# Patient Record
Sex: Male | Born: 1975
Health system: Southern US, Community
[De-identification: ages and names within clinical notes are randomized; demographics above are authoritative.]

## PROBLEM LIST (undated history)

## (undated) DIAGNOSIS — I10 Essential (primary) hypertension: Secondary | ICD-10-CM

## (undated) DIAGNOSIS — E119 Type 2 diabetes mellitus without complications: Secondary | ICD-10-CM

## (undated) HISTORY — DX: Essential (primary) hypertension: I10

## (undated) HISTORY — DX: Morbid (severe) obesity due to excess calories: E66.01

---

## 1999-07-29 ENCOUNTER — Emergency Department (HOSPITAL_COMMUNITY): Admission: EM | Admit: 1999-07-29 | Discharge: 1999-07-29 | Payer: Self-pay | Admitting: Internal Medicine

## 2000-08-27 ENCOUNTER — Emergency Department (HOSPITAL_COMMUNITY): Admission: EM | Admit: 2000-08-27 | Discharge: 2000-08-27 | Payer: Self-pay | Admitting: *Deleted

## 2001-01-15 ENCOUNTER — Emergency Department (HOSPITAL_COMMUNITY): Admission: EM | Admit: 2001-01-15 | Discharge: 2001-01-15 | Payer: Self-pay

## 2001-06-21 ENCOUNTER — Emergency Department (HOSPITAL_COMMUNITY): Admission: EM | Admit: 2001-06-21 | Discharge: 2001-06-21 | Payer: Self-pay | Admitting: Emergency Medicine

## 2001-06-21 ENCOUNTER — Encounter: Payer: Self-pay | Admitting: Emergency Medicine

## 2001-07-01 ENCOUNTER — Emergency Department (HOSPITAL_COMMUNITY): Admission: EM | Admit: 2001-07-01 | Discharge: 2001-07-01 | Payer: Self-pay | Admitting: Internal Medicine

## 2001-11-07 ENCOUNTER — Encounter: Payer: Self-pay | Admitting: Emergency Medicine

## 2001-11-07 ENCOUNTER — Emergency Department (HOSPITAL_COMMUNITY): Admission: EM | Admit: 2001-11-07 | Discharge: 2001-11-07 | Payer: Self-pay | Admitting: Emergency Medicine

## 2002-11-24 ENCOUNTER — Emergency Department (HOSPITAL_COMMUNITY): Admission: EM | Admit: 2002-11-24 | Discharge: 2002-11-24 | Payer: Self-pay | Admitting: Emergency Medicine

## 2005-10-26 ENCOUNTER — Emergency Department (HOSPITAL_COMMUNITY): Admission: EM | Admit: 2005-10-26 | Discharge: 2005-10-26 | Payer: Self-pay | Admitting: Emergency Medicine

## 2009-09-09 ENCOUNTER — Observation Stay (HOSPITAL_COMMUNITY): Admission: EM | Admit: 2009-09-09 | Discharge: 2009-09-10 | Payer: Self-pay | Admitting: Emergency Medicine

## 2009-10-05 DIAGNOSIS — I10 Essential (primary) hypertension: Secondary | ICD-10-CM

## 2009-10-05 DIAGNOSIS — R079 Chest pain, unspecified: Secondary | ICD-10-CM

## 2009-10-05 HISTORY — DX: Essential (primary) hypertension: I10

## 2009-10-08 ENCOUNTER — Ambulatory Visit: Payer: Self-pay | Admitting: Cardiovascular Disease

## 2009-10-08 DIAGNOSIS — R9431 Abnormal electrocardiogram [ECG] [EKG]: Secondary | ICD-10-CM

## 2009-10-09 ENCOUNTER — Encounter: Payer: Self-pay | Admitting: Cardiovascular Disease

## 2009-10-16 ENCOUNTER — Ambulatory Visit: Payer: Self-pay | Admitting: Internal Medicine

## 2009-11-08 ENCOUNTER — Encounter: Payer: Self-pay | Admitting: Cardiovascular Disease

## 2009-11-08 ENCOUNTER — Ambulatory Visit (HOSPITAL_COMMUNITY): Admission: RE | Admit: 2009-11-08 | Discharge: 2009-11-08 | Payer: Self-pay | Admitting: Cardiovascular Disease

## 2009-11-08 ENCOUNTER — Ambulatory Visit: Payer: Self-pay

## 2009-11-08 ENCOUNTER — Encounter (INDEPENDENT_AMBULATORY_CARE_PROVIDER_SITE_OTHER): Payer: Self-pay

## 2009-11-08 ENCOUNTER — Ambulatory Visit: Payer: Self-pay | Admitting: Cardiology

## 2009-12-10 ENCOUNTER — Emergency Department (HOSPITAL_COMMUNITY): Admission: EM | Admit: 2009-12-10 | Discharge: 2009-12-10 | Payer: Self-pay | Admitting: Emergency Medicine

## 2009-12-26 ENCOUNTER — Encounter: Payer: Self-pay | Admitting: Cardiovascular Disease

## 2010-01-24 ENCOUNTER — Emergency Department (HOSPITAL_COMMUNITY): Admission: EM | Admit: 2010-01-24 | Discharge: 2010-01-24 | Payer: Self-pay | Admitting: Emergency Medicine

## 2010-04-17 IMAGING — CR DG CHEST 2V
2 series · 2 of 2 positions shown · non-contrast
Comparison: Chest radiograph performed 09/09/2009

CLINICAL DATA: Palpitations, cough and congestion.

CHEST - 2 VIEW

[w chest pa *]
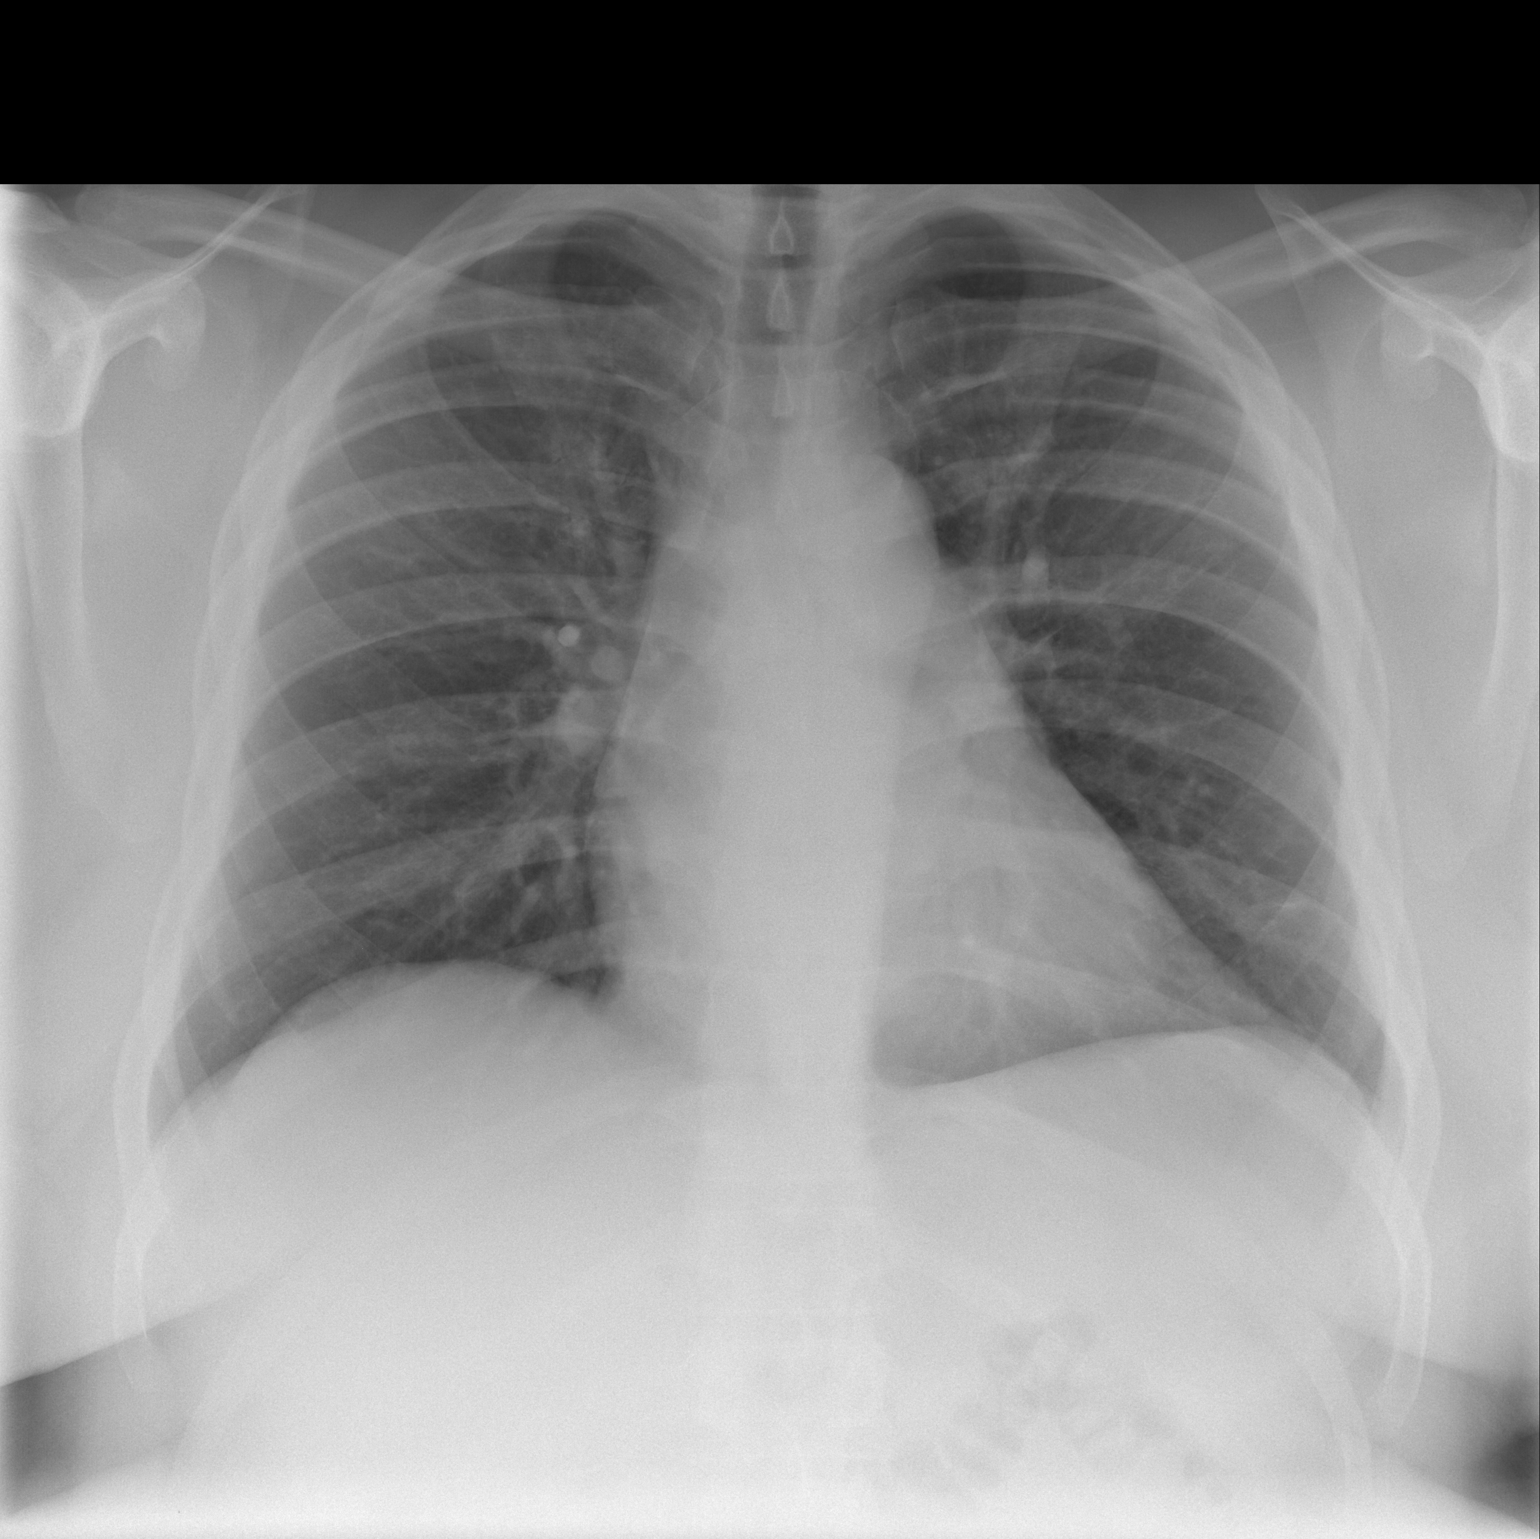

[w chest lat *]
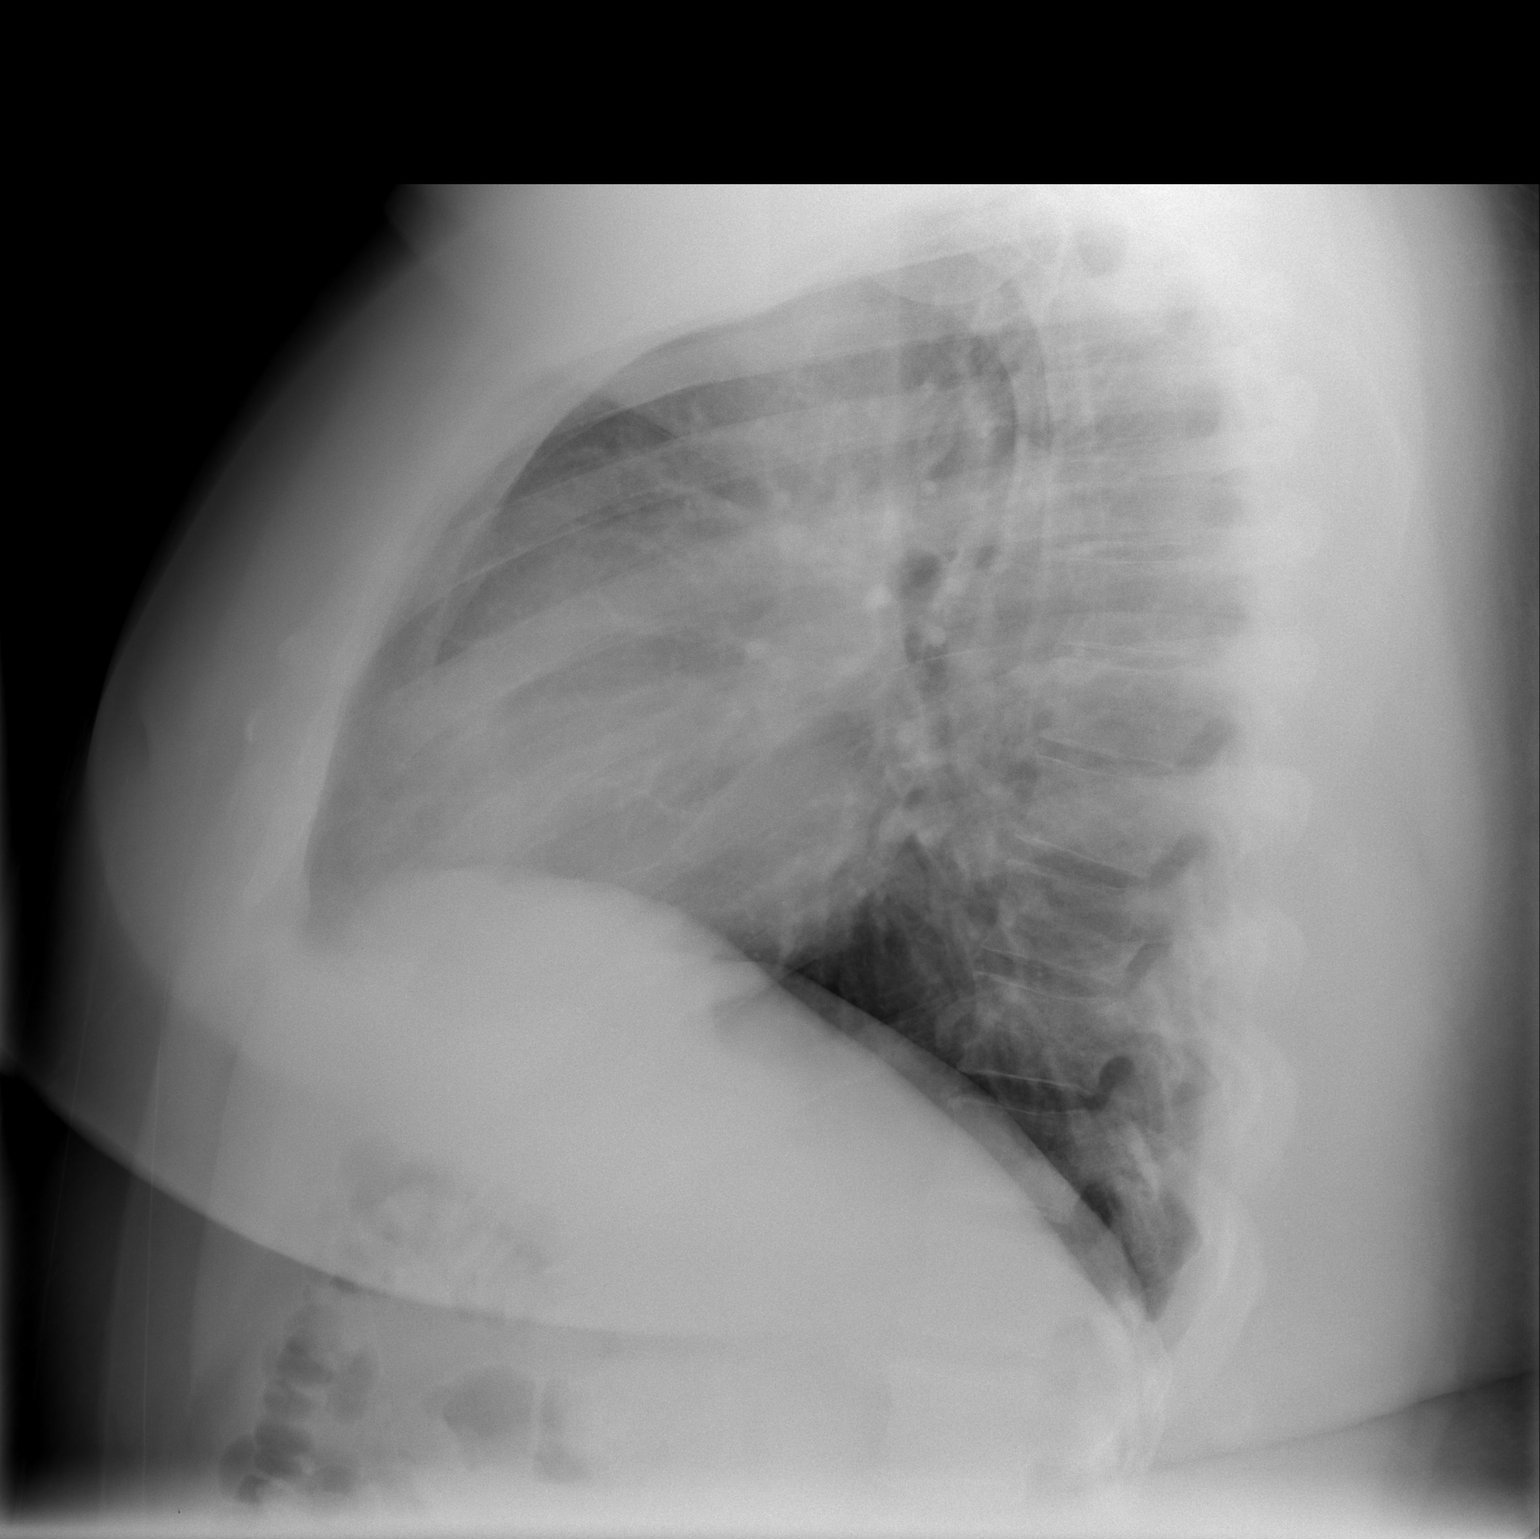

[2 of 2 positions shown; findings below may reference images not displayed]

FINDINGS: The lungs are well-aerated; minimal linear atelectasis is
noted at the left lung base.  There is no evidence of focal
opacification, pleural effusion or pneumothorax.

The heart is normal in size; the mediastinal contour is within
normal limits.  No acute osseous abnormalities are seen.
IMPRESSION: Minimal left basilar linear atelectasis.

## 2010-07-31 ENCOUNTER — Emergency Department (HOSPITAL_COMMUNITY): Admission: EM | Admit: 2010-07-31 | Discharge: 2010-08-01 | Payer: Self-pay | Admitting: Emergency Medicine

## 2010-08-27 ENCOUNTER — Ambulatory Visit: Payer: Self-pay | Admitting: Cardiovascular Disease

## 2010-08-27 DIAGNOSIS — R002 Palpitations: Secondary | ICD-10-CM | POA: Insufficient documentation

## 2010-08-30 ENCOUNTER — Telehealth (INDEPENDENT_AMBULATORY_CARE_PROVIDER_SITE_OTHER): Payer: Self-pay | Admitting: *Deleted

## 2010-09-06 ENCOUNTER — Ambulatory Visit: Payer: Self-pay | Admitting: Cardiovascular Disease

## 2010-11-08 ENCOUNTER — Telehealth (INDEPENDENT_AMBULATORY_CARE_PROVIDER_SITE_OTHER): Payer: Self-pay | Admitting: *Deleted

## 2010-11-11 ENCOUNTER — Telehealth: Payer: Self-pay | Admitting: Cardiovascular Disease

## 2010-11-28 ENCOUNTER — Emergency Department (HOSPITAL_COMMUNITY): Admission: EM | Admit: 2010-11-28 | Discharge: 2010-07-15 | Payer: Self-pay | Admitting: Emergency Medicine

## 2010-11-28 ENCOUNTER — Emergency Department (HOSPITAL_COMMUNITY): Admission: EM | Admit: 2010-11-28 | Discharge: 2009-12-30 | Payer: Self-pay | Admitting: Emergency Medicine

## 2011-01-21 NOTE — Progress Notes (Signed)
Summary: rx refill  Phone Note Refill Request Message from:  Patient on November 11, 2010 12:53 PM  Refills Requested: Medication #1:  LISINOPRIL 20 MG TABS Take one tablet by mouth daily. [id number]   Method Requested: Telephone to Pharmacy Initial call taken by: Roe Coombs,  November 11, 2010 12:53 PM    Prescriptions: LISINOPRIL 20 MG TABS (LISINOPRIL) Take one tablet by mouth daily  #30 Tablet x 12   Entered by:   Kem Parkinson   Authorized by:   Colon Branch, MD, [facility]   Signed by:   Kem Parkinson on 11/11/2010   Method used:   Electronically to        [id number] (retail)       [address]       [place], [place]  [address]       Ph: [phone] or [phone]       Fax: [phone]   RxID:   [id number]

## 2011-01-21 NOTE — Progress Notes (Signed)
  Phone Note Outgoing Call   Call placed by: Scherrie Bateman, LPN,  November 08, 2010 10:05 AM Call placed to: Patient Summary of Call: ATTEMPTED TO NOTIFY PT OF MONITOR RESULTS NSR NO SIG [facility] NUM DISCONNECTED AND NOT WORKING TODAY WILL TRY AGAIN ON MON. Initial call taken by: Scherrie Bateman, LPN,  November 08, 2010 10:06 AM  Follow-up for Phone Call        CALLED WORK PT NOT WORKING TODAY [facility] ANOTHER DATE Scherrie Bateman, LPN  November 19, 2010 10:21 AM  PT AWARE OF MONITOR RESULTS. Follow-up by: Scherrie Bateman, LPN,  November 20, 2010 4:04 PM

## 2011-01-21 NOTE — Progress Notes (Signed)
Summary: Event monitor 09/06/10  Phone Note Outgoing Call Call back at [facility] Phone [phone]   Call placed by: Stanton Kidney, EMT-P,  August 30, 2010 4:17 PM Summary of Call: 08/30/10 left message for pt. to call back to schedule Event monitor. 09/02/10 Pt. returned call,...scheduled for 09/06/10 at 11:00 am for event monitor.

## 2011-01-21 NOTE — Assessment & Plan Note (Signed)
Summary: f1y/per pt call/lg   Primary Provider:  Yetta Barre  CC:  chest pain .  History of Present Illness: 35 yo obese male seen prev for atypical SSCP.  Normal stress echo 11/10.  CRF include HTN on Rx. He continues to be stressed and unhappy at work with [employer].  He seems overly concerned with cardiac issues and has very atypical symptoms.  He has sharp constant pain in his neck  A dull ache in his left arm.  Has been to the ER on occasion with R/O.  Has palpitatoins at night.  Has had nitro in past with no immediate help.  Indicated to paitient that his symptoms were atypical and he was too large to have a myovue or cardiac CT.  Invasive cath not warranted at this time given risk of stroke or bleeding complicaitons.  If his Allans test was negative he may be a candidate for radial cath.  Will give him a monitor for his nocturnal palpitaitons and F/U in 6 months.  Continue ASA and Rx of HTN   Current Problems (verified): 1)  Palpitations  (ICD-785.1) 2)  Electrocardiogram, Abnormal  (ICD-794.31) 3)  Hypertension  (ICD-401.9) 4)  Morbid Obesity  (ICD-278.01) 5)  Chest Pain  (ICD-786.50)  Current Medications (verified): 1)  Aspirin 81 Mg Tbec (Aspirin) .... Take One Tablet By Mouth Daily 2)  Lisinopril 20 Mg Tabs (Lisinopril) .... Take One Tablet By Mouth Daily  Allergies (verified): No Known Drug Allergies  Past History:  Past Medical History: Last updated: 10/05/2009 Current Problems:  HYPERTENSION (ICD-401.9) MORBID OBESITY (ICD-278.01) CHEST PAIN (ICD-786.50) Eval at [facility] 2006 and 09/09/2009 R/O no stress testing done Borderline Type 2 DM  HbA1c 6.4 08/2009  Past Surgical History: Last updated: 10/16/2009 Denies surgical history  Family History: Last updated: 10/05/2009   The patient's mother is alive.  She has no history of  coronary artery disease but is a diabetic.  The patient's father died in his  43s from complications of coronary artery disease and diabetes  and had his  first MI in his late 63s at the family's best estimation.  The patient has  two brothers and nine sisters, none of whom have coronary artery disease but  multiple ones do in fact have diabetes.  Social History: Last updated: 10/05/2009 He works at [employer].  He is an occasional   drinker, only maybe on the weekends when he goes out with friends.   Denies any tobacco.  States he did smoke some marijuana earlier this   year but he has never done any other illicit drugs.  He is single at   this point.      Review of Systems       Denies fever, malais, weight loss, blurry vision, decreased visual acuity, cough, sputum, SOB, hemoptysis, pleuritic pain, , heartburn, abdominal pain, melena, lower extremity edema, claudication, or rash.   Vital Signs:  Patient profile:   35 year old male Height:      73 inches Weight:      406 pounds BMI:     53.76 Pulse rate:   80 / minute Resp:     16 per minute BP sitting:   138 / 80  (left arm)  Vitals Entered By: Kem Parkinson (August 27, 2010 3:01 PM)  Physical Exam  General:  Affect appropriate Healthy:  appears stated age HEENT: normal Neck supple with no adenopathy JVP normal no bruits no thyromegaly Lungs clear with no wheezing and  good diaphragmatic motion Heart:  S1/S2 no murmur,rub, gallop or click PMI normal Abdomen: benighn, BS positve, no tenderness, no AAA no bruit.  No HSM or HJR Distal pulses intact with no bruits No edema Neuro non-focal Skin warm and dry    Impression & Recommendations:  Problem # 1:  PALPITATIONS (ICD-785.1) Event monitor and F/U His updated medication list for this problem includes:    Aspirin 81 Mg Tbec (Aspirin) .Marland Kitchen... Take one tablet by mouth daily    Lisinopril 20 Mg Tabs (Lisinopril) .Marland Kitchen... Take one tablet by mouth daily  Orders: Event (Event)  Problem # 2:  CHEST PAIN (ICD-786.50) Atypical, normal stress echo Observe His updated medication list for this problem  includes:    Aspirin 81 Mg Tbec (Aspirin) .Marland Kitchen... Take one tablet by mouth daily    Lisinopril 20 Mg Tabs (Lisinopril) .Marland Kitchen... Take one tablet by mouth daily  Problem # 3:  HYPERTENSION (ICD-401.9) Well controlled The following medications were removed from the medication list:    Hydrochlorothiazide 12.5 Mg Tabs (Hydrochlorothiazide) .Marland Kitchen... Take one tablet by mouth daily. His updated medication list for this problem includes:    Aspirin 81 Mg Tbec (Aspirin) .Marland Kitchen... Take one tablet by mouth daily    Lisinopril 20 Mg Tabs (Lisinopril) .Marland Kitchen... Take one tablet by mouth daily  Patient Instructions: 1)  Your physician recommends that you schedule a follow-up appointment in: 6 MONTHS 2)  Your physician recommends that you continue on your current medications as directed. Please refer to the Current Medication list given to you today. 3)  Your physician has recommended that you wear an event monitor.  Event monitors are medical devices that record the heart's electrical activity. Doctors most often use these monitors to diagnose arrhythmias. Arrhythmias are problems with the speed or rhythm of the heartbeat. The monitor is a small, portable device. You can wear one while you do your normal daily activities. This is usually used to diagnose what is causing palpitations/syncope (passing out).   EKG Report  Procedure date:  08/27/2010  Findings:      NSR 70 Poor R wave progression from body habitus Normal ECG

## 2011-01-21 NOTE — Letter (Signed)
Summary: [facility]   Imported By: Kassie Mends 01/22/2010 09:34:11  _____________________________________________________________________  External Attachment:    Type:   Image     Comment:   External Document

## 2011-03-07 LAB — DIFFERENTIAL
Basophils Absolute: 0 10*3/uL (ref 0.0–0.1)
Basophils Relative: 1 % (ref 0–1)
Eosinophils Absolute: 0.1 10*3/uL (ref 0.0–0.7)
Eosinophils Relative: 2 % (ref 0–5)
Lymphocytes Relative: 30 % (ref 12–46)
Lymphs Abs: 1.4 10*3/uL (ref 0.7–4.0)
Monocytes Absolute: 0.5 10*3/uL (ref 0.1–1.0)
Monocytes Relative: 11 % (ref 3–12)
Neutro Abs: 2.6 10*3/uL (ref 1.7–7.7)
Neutrophils Relative %: 57 % (ref 43–77)

## 2011-03-07 LAB — COMPREHENSIVE METABOLIC PANEL
ALT: 15 U/L (ref 0–53)
AST: 20 U/L (ref 0–37)
Albumin: 4 g/dL (ref 3.5–5.2)
Alkaline Phosphatase: 67 U/L (ref 39–117)
BUN: 7 mg/dL (ref 6–23)
CO2: 24 mEq/L (ref 19–32)
Calcium: 8.9 mg/dL (ref 8.4–10.5)
Chloride: 102 mEq/L (ref 96–112)
Creatinine, Ser: 1.29 mg/dL (ref 0.4–1.5)
GFR calc Af Amer: >60 mL/min (ref >60)
GFR calc non Af Amer: >60 mL/min (ref >60)
Glucose, Bld: 93 mg/dL (ref 70–99)
Potassium: 4 mEq/L (ref 3.5–5.1)
Sodium: 134 mEq/L — ABNORMAL LOW (ref 135–145)
Total Bilirubin: 0.4 mg/dL (ref 0.3–1.2)
Total Protein: 7.2 g/dL (ref 6.0–8.3)

## 2011-03-07 LAB — CBC
Hemoglobin: 16.1 g/dL (ref 13.0–17.0)
MCH: 29.7 pg (ref 26.0–34.0)
Platelets: 235 10*3/uL (ref 150–400)

## 2011-03-07 LAB — POCT CARDIAC MARKERS
Myoglobin, poc: 61.7 ng/mL (ref 12–200)
Troponin i, poc: <0.05 ng/mL (ref 0.00–0.09)

## 2011-03-08 LAB — CBC
Hemoglobin: 15.1 g/dL (ref 13.0–17.0)
MCH: 30.6 pg (ref 26.0–34.0)
Platelets: 230 10*3/uL (ref 150–400)
Platelets: 237 10*3/uL (ref 150–400)
RBC: 4.93 MIL/uL (ref 4.22–5.81)
RBC: 4.81 MIL/uL (ref 4.22–5.81)
RDW: 13.4 % (ref 11.5–15.5)
WBC: 3.6 10*3/uL — ABNORMAL LOW (ref 4.0–10.5)

## 2011-03-08 LAB — BASIC METABOLIC PANEL
BUN: 11 mg/dL (ref 6–23)
Chloride: 104 mEq/L (ref 96–112)
Creatinine, Ser: 1.2 mg/dL (ref 0.4–1.5)
GFR calc Af Amer: >60 mL/min (ref >60)
Glucose, Bld: 150 mg/dL — ABNORMAL HIGH (ref 70–99)
Potassium: 3.7 mEq/L (ref 3.5–5.1)

## 2011-03-08 LAB — DIFFERENTIAL
Basophils Absolute: 0 10*3/uL (ref 0.0–0.1)
Basophils Absolute: 0 10*3/uL (ref 0.0–0.1)
Lymphocytes Relative: 35 % (ref 12–46)
Lymphocytes Relative: 35 % (ref 12–46)
Lymphs Abs: 1.5 10*3/uL (ref 0.7–4.0)
Lymphs Abs: 1.3 10*3/uL (ref 0.7–4.0)
Monocytes Absolute: 0.5 10*3/uL (ref 0.1–1.0)
Neutro Abs: 1.5 10*3/uL — ABNORMAL LOW (ref 1.7–7.7)
Neutrophils Relative %: 49 % (ref 43–77)
Neutrophils Relative %: 42 % — ABNORMAL LOW (ref 43–77)

## 2011-03-08 LAB — POCT CARDIAC MARKERS
CKMB, poc: <1 ng/mL — ABNORMAL LOW (ref 1.0–8.0)
CKMB, poc: <1 ng/mL — ABNORMAL LOW (ref 1.0–8.0)
Troponin i, poc: <0.05 ng/mL (ref 0.00–0.09)
Troponin i, poc: <0.05 ng/mL (ref 0.00–0.09)

## 2011-03-08 LAB — POCT I-STAT, CHEM 8
Chloride: 105 mEq/L (ref 96–112)
Sodium: 140 mEq/L (ref 135–145)

## 2011-03-24 LAB — CBC
Hemoglobin: 13.9 g/dL (ref 13.0–17.0)
MCHC: 33.8 g/dL (ref 30.0–36.0)
MCV: 88.1 fL (ref 78.0–100.0)

## 2011-03-24 LAB — DIFFERENTIAL
Eosinophils Relative: 2 % (ref 0–5)
Lymphocytes Relative: 23 % (ref 12–46)
Lymphs Abs: 0.9 10*3/uL (ref 0.7–4.0)
Monocytes Absolute: 0.5 10*3/uL (ref 0.1–1.0)
Monocytes Relative: 15 % — ABNORMAL HIGH (ref 3–12)
Neutrophils Relative %: 60 % (ref 43–77)

## 2011-03-24 LAB — BASIC METABOLIC PANEL
BUN: 11 mg/dL (ref 6–23)
Creatinine, Ser: 1.15 mg/dL (ref 0.4–1.5)
GFR calc Af Amer: >60 mL/min (ref >60)
GFR calc non Af Amer: >60 mL/min (ref >60)
Glucose, Bld: 118 mg/dL — ABNORMAL HIGH (ref 70–99)

## 2011-03-24 LAB — POCT CARDIAC MARKERS: CKMB, poc: <1 ng/mL — ABNORMAL LOW (ref 1.0–8.0)

## 2011-03-28 LAB — DIFFERENTIAL
Basophils Relative: 0 % (ref 0–1)
Eosinophils Absolute: 0.1 10*3/uL (ref 0.0–0.7)
Eosinophils Relative: 3 % (ref 0–5)
Lymphs Abs: 1.2 10*3/uL (ref 0.7–4.0)
Monocytes Absolute: 0.4 10*3/uL (ref 0.1–1.0)
Monocytes Relative: 9 % (ref 3–12)
Neutrophils Relative %: 62 % (ref 43–77)

## 2011-03-28 LAB — COMPREHENSIVE METABOLIC PANEL
ALT: 15 U/L (ref 0–53)
AST: 18 U/L (ref 0–37)
Albumin: 3.4 g/dL — ABNORMAL LOW (ref 3.5–5.2)
Alkaline Phosphatase: 73 U/L (ref 39–117)
Calcium: 8.6 mg/dL (ref 8.4–10.5)
GFR calc Af Amer: >60 mL/min (ref >60)
Glucose, Bld: 127 mg/dL — ABNORMAL HIGH (ref 70–99)
Potassium: 3.2 mEq/L — ABNORMAL LOW (ref 3.5–5.1)
Sodium: 137 mEq/L (ref 135–145)
Total Protein: 7.1 g/dL (ref 6.0–8.3)

## 2011-03-28 LAB — CBC
Hemoglobin: 14.1 g/dL (ref 13.0–17.0)
MCHC: 33.8 g/dL (ref 30.0–36.0)
MCHC: 34.5 g/dL (ref 30.0–36.0)
MCV: 88.4 fL (ref 78.0–100.0)
MCV: 88.9 fL (ref 78.0–100.0)
Platelets: 195 10*3/uL (ref 150–400)
RBC: 4.51 MIL/uL (ref 4.22–5.81)
WBC: 3.8 10*3/uL — ABNORMAL LOW (ref 4.0–10.5)
WBC: 4.7 10*3/uL (ref 4.0–10.5)

## 2011-03-28 LAB — CK TOTAL AND CKMB (NOT AT ARMC)
CK, MB: 1.1 ng/mL (ref 0.3–4.0)
Relative Index: 0.4 (ref 0.0–2.5)

## 2011-03-28 LAB — CARDIAC PANEL(CRET KIN+CKTOT+MB+TROPI)
Relative Index: 0.4 (ref 0.0–2.5)
Relative Index: 0.4 (ref 0.0–2.5)
Total CK: 221 U/L (ref 7–232)
Total CK: 258 U/L — ABNORMAL HIGH (ref 7–232)
Troponin I: <0.01 ng/mL (ref 0.00–0.06)
Troponin I: <0.01 ng/mL (ref 0.00–0.06)
Troponin I: <0.01 ng/mL (ref 0.00–0.06)

## 2011-03-28 LAB — LIPID PANEL
HDL: 29 mg/dL — ABNORMAL LOW (ref >39)
Total CHOL/HDL Ratio: 4.2 RATIO

## 2011-03-28 LAB — BASIC METABOLIC PANEL
BUN: 6 mg/dL (ref 6–23)
CO2: 28 mEq/L (ref 19–32)
Calcium: 8.3 mg/dL — ABNORMAL LOW (ref 8.4–10.5)
Chloride: 106 mEq/L (ref 96–112)
Creatinine, Ser: 1.15 mg/dL (ref 0.4–1.5)
GFR calc Af Amer: >60 mL/min (ref >60)

## 2011-03-28 LAB — TSH: TSH: 0.665 u[IU]/mL (ref 0.350–4.500)

## 2011-03-28 LAB — HEMOGLOBIN A1C
Hgb A1c MFr Bld: 6.4 % — ABNORMAL HIGH (ref 4.6–6.1)
Mean Plasma Glucose: 137 mg/dL

## 2011-03-28 LAB — POCT CARDIAC MARKERS
CKMB, poc: <1 ng/mL — ABNORMAL LOW (ref 1.0–8.0)
Troponin i, poc: <0.05 ng/mL (ref 0.00–0.09)

## 2011-03-28 LAB — CALCIUM: Calcium: 8.2 mg/dL — ABNORMAL LOW (ref 8.4–10.5)

## 2011-05-09 NOTE — Consult Note (Signed)
Arthur Avila NO.:  [id number]   MEDICAL RECORD NO.:  [id number]          PATIENT TYPE:  EMS   LOCATION:  ED                           FACILITY:  [facility]   PHYSICIAN:  Lonia Blood, M.D.DATE OF BIRTH:  May 26, 1976   DATE OF CONSULTATION:  10/26/2005  DATE OF DISCHARGE:                                   CONSULTATION   REASON FOR CONSULTATION:  Chest pain of unclear etiology.   HISTORY OF PRESENT ILLNESS:  [name] is a very nice 35 year old  gentleman with no significant past medical history who presents to [facility] emergency room with complaints of chest heaviness for approximately 48  hours.  The patient experienced first onset of symptoms while sitting in a  chair at work.  He describes the pain as a heaviness and a difficulty  catching my breath focused about his chest and epigastric area.  This is a  nonradiating pain.  This sensation actually improves when the patient  ambulates.  It is not related to specific movements.  There are no other  significant aggravating factors.  The patient has not noticed any  significant dyspnea on exertion above his baseline.  There has been some  sense of nausea over the 48 hours.  The patient has felt that his heart was  racing.  The patient himself states I think this may be related to  anxiety, like an anxiety attack.  I have been under a lot of stress lately.  He reports no prior history of similar symptoms.  There has been no  diaphoresis associated with these symptoms.  There has been no  regurgitation.  There has been no change in diet, no change in bowel habits.  There has been no significant wheezing.  There has been no pleuritic-type or  peripheral chest pain.  He is urinating without difficulty.  He has had no  acute visual changes.  He has no headache.  There has been no change in  mental status.   REVIEW OF SYSTEMS:  [name] does admit to significant difficulty with  insomnia.  He  reports that he has both trouble initiating sleep and trouble  maintaining sleep state once he does get to sleep.  Otherwise, [name]  has been in his usual state of health, with the exception of the elements in  the history of present illness noted above.   OUTPATIENT MEDICATIONS:  None.   ALLERGIES:  NO KNOWN DRUG ALLERGIES.   PAST MEDICAL HISTORY:  None.   FAMILY HISTORY:  The patient's mother is alive.  She has no history of  coronary artery disease but is a diabetic.  The patient's father died in his  60s from complications of coronary artery disease and diabetes and had his  first MI in his late 47s at the family's best estimation.  The patient has  two brothers and nine sisters, none of whom have coronary artery disease but  multiple ones do in fact have diabetes.   SOCIAL HISTORY:  The patient lives in [place].  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  He does not smoke, he  does not drink.   LABORATORY DATA:  CBC is normal.  Electrolytes are normal.  Serum glucose is  134 on the patient's BMET.  LFTs are normal.  BNP is less than 30.  Acute  cardiac panel through the ER is negative.  Chest x-ray reveals no acute  disease.  D-dimer is in fact normal.  Coags are normal. Albumin is 3.4.  Urinalysis is normal.  12-lead EKG reveals normal sinus rhythm at 84 beats  per minute with no acute ST or T wave changes.   PHYSICAL EXAMINATION:  GENERAL:  Well-developed, well-nourished male in no  acute respiratory distress.  HEENT:  Normocephalic, atraumatic.  Pupils equal, round, reactive to light  and accommodation.  Extraocular muscles intact.  __________ clear.  NECK:  No JVD, no lymphadenopathy, no thyromegaly.  LUNGS:  Clear to auscultation bilaterally without wheezes or rhonchi.  CARDIOVASCULAR:  Regular rate and rhythm without murmurs, rubs, or gallops.  Normal S1 and S2.  ABDOMEN:  Obese, soft.  Bowel sounds positive.  No  hepatosplenomegaly, no  rebound, no ascites.  EXTREMITIES:  Trace bilateral lower extremity edema.  NEUROLOGIC:  Nonfocal.  Cranial nerves II-XII intact bilaterally.  Intact  sensation to touch throughout.  5/5 strength in bilateral upper and lower  extremities.   RECOMMENDATIONS:  1.  Uncontrolled hypertension.  [name] clearly does have uncontrolled      hypertension.  There is likely some element of sampling error, however,      given the extremely large size of the patient's arm and the inability      for even a large cuff to appropriately fit him.  At this time, there is      no evidence of a hypertensive crisis.  By my history, I feel that his      baseline essential hypertension is likely further aggravated by      emotional stress.  I have counseled him extensively on means of dealing      with his stress.  I will go ahead and initiate hydrochlorothiazide at 25      mg p.o. daily.  I have counseled the patient extensively on the side      effects of hydrochlorothiazide, to include hypokalemia.  I have advised      him that a recheck of his basic metabolic panel should be accomplished      in approximately two weeks and have explained to him that it is      imperative for him to get himself situated with a new doctor in that      time.  I further counseled him on the longterm sequelae of uncontrolled      hypertension.  2.  Obesity.  I have counseled [name] extensively in regard to his      obesity.  I have provided him with strategies for weight loss and have      provided for him a target body weight of approximately 200 pounds based      upon his height of 6 feet 2 inches.  Ideal body weight actually      calculates out to around 180 pounds, but I feel that this is very      unlikely in this gentleman.  I have counseled him on diet modification.      I have counseled him on exercise, focusing on walking. 3.  Chest  discomfort.  The exact etiology of [name]  chest discomfort      is not clear.  The predominant feeling for me at this time is that this      is likely related to anxiety.  He has classic symptoms of a panic      attack.  Unfortunately, the patient's drug benefit has not kicked in at      this time.  I do not feel comfortable placing him on benzodiazepines and      then sending him out.  I have discussed modalities for anxiety      modification and changes in his lifestyle.  He has agreed to follow up      with this.  12-lead electrocardiogram reveals no acute changes, and      cardiac point-of-care results in the emergency room were negative.  The      probability of this being significant coronary artery disease in a 43-      year-old is extremely low.  He does have risk factors to include obesity      and hypertension and probable early diabetes.  He also has a positive      family history.  He does not smoke, and he has not, however, suffered      the ravages of diabetes for years.  Therefore, I do not feel that      inpatient treatment for rule out is necessary.  I have counseled him,      however, that we cannot be 100% sure that this is not cardiac in origin      and have advised him that if he should continue to suffer with      difficulty that he should return immediately to the emergency room at      which time admission would be carried out.  4.  Elevated serum glucose.  A serum glucose of 134 was obtained during the      patient's stay in the emergency room.  I have informed the patient that      this is not normal and likely represents impaired glucose tolerance if      not the actual full onset of type 2 diabetes.  I have counseled the      patient as to the connection between his weight and his diabetes as well      as the link to his family history.  I have explained to the patient that      aggressive weight loss and weight control would benefit him      significantly and possibly even eliminate a future diagnosis  of      diabetes.  I have counseled him extensively on a diabetic diet and      advised him to begin following this immediately.  His mother is present      and is diabetic and agrees that she can assist in managing his diet.  I      have advised the patient that he should seek prompt medical attention in      the outpatient setting for this.  5.  Insomnia.  The patient has difficulty with insomnia.  Full history      reveals that he does consume large amounts of green tea during the      day.  It is likely that his insomnia is related to excessive caffeine      intake.  I have counseled him in discontinuation of all caffeine-  containing products.  I have advised him that over-the-counter Benadryl      could be useful to help initiate sleep but have further explained to him      that stress modification and caffeine elimination would be his number      one goals in regards to sleeping better. 6.  Outpatient health care.  [name] has been at his current job for      approximately a month and a half.  He reports that his health insurance      is soon to kick in.  I have advised him of the possibility of visiting      [employer] and explained to him that he should follow up      there.  I have also explained that once his health insurance kicks in,      that he would be free to choose any office in town.  I have explained to      him that regardless of what he chooses to do, it is imperative that he      find himself a primary care physician in the next week and set up an      appointment for followup.  I have explained to him that followup of his      hypertension and blood sugar are of extreme importance and also that he      will need blood tests done in approximately two weeks as detailed above.      He voices understanding of this and agrees to obtain himself a physician      in the outpatient setting.      Lonia Blood, M.D.  Electronically  Signed     JTM/MEDQ  D:  10/26/2005  T:  10/26/2005  Job:  [id number]

## 2011-11-17 ENCOUNTER — Other Ambulatory Visit: Payer: Self-pay | Admitting: Cardiovascular Disease

## 2012-03-18 ENCOUNTER — Encounter: Payer: Self-pay | Admitting: Internal Medicine

## 2012-03-18 ENCOUNTER — Ambulatory Visit (HOSPITAL_COMMUNITY)
Admission: RE | Admit: 2012-03-18 | Discharge: 2012-03-18 | Disposition: A | Discharge status: Home / Self Care | Payer: BC Managed Care – PPO | Source: Ambulatory Visit | Attending: Internal Medicine | Admitting: Internal Medicine

## 2012-03-18 ENCOUNTER — Ambulatory Visit (INDEPENDENT_AMBULATORY_CARE_PROVIDER_SITE_OTHER): Payer: BC Managed Care – PPO | Admitting: Internal Medicine

## 2012-03-18 VITALS — BP 118/82 | HR 76 | Temp 98.6°F | Resp 16 | Wt 393.0 lb

## 2012-03-18 DIAGNOSIS — M25562 Pain in left knee: Secondary | ICD-10-CM

## 2012-03-18 DIAGNOSIS — E7800 Pure hypercholesterolemia, unspecified: Secondary | ICD-10-CM

## 2012-03-18 DIAGNOSIS — M25561 Pain in right knee: Secondary | ICD-10-CM

## 2012-03-18 DIAGNOSIS — M1710 Unilateral primary osteoarthritis, unspecified knee: Secondary | ICD-10-CM

## 2012-03-18 DIAGNOSIS — I10 Essential (primary) hypertension: Secondary | ICD-10-CM

## 2012-03-18 DIAGNOSIS — M25569 Pain in unspecified knee: Secondary | ICD-10-CM

## 2012-03-18 HISTORY — DX: Pure hypercholesterolemia, unspecified: E78.00

## 2012-03-18 HISTORY — DX: Unilateral primary osteoarthritis, unspecified knee: M17.10

## 2012-03-18 HISTORY — DX: Pain in right knee: M25.561

## 2012-03-18 MED ORDER — NAPROXEN SODIUM ER 375 MG PO TB24: 2.0000 | ORAL_TABLET | Freq: Every day | ORAL | Status: DC

## 2012-03-18 MED ORDER — OLMESARTAN MEDOXOMIL 20 MG PO TABS: 20.0000 mg | ORAL_TABLET | Freq: Every day | ORAL | Status: DC

## 2012-03-18 NOTE — Assessment & Plan Note (Signed)
He can try nsaid therapy

## 2012-03-18 NOTE — Assessment & Plan Note (Signed)
unchanged

## 2012-03-18 NOTE — Assessment & Plan Note (Signed)
Due to the cough he will need to stop the lisinopril and change to benicar, today I will check his lytes and renal function

## 2012-03-18 NOTE — Patient Instructions (Signed)
Degenerative Arthritis You have osteoarthritis. This is the wear and tear arthritis that comes with aging. It is also called degenerative arthritis. This is common in people past middle age. It is caused by stress on the joints. The large weight bearing joints of the lower extremities are most often affected. The knees, hips, back, neck, and hands can become painful, swollen, and stiff. This is the most common type of arthritis. It comes on with age, carrying too much weight, or from an injury. Treatment includes resting the sore joint until the pain and swelling improve. Crutches or a walker may be needed for severe flares. Only take over-the-counter or prescription medicines for pain, discomfort, or fever as directed by your caregiver. Local heat therapy may improve motion. Cortisone shots into the joint are sometimes used to reduce pain and swelling during flares. Osteoarthritis is usually not crippling and progresses slowly. There are things you can do to decrease pain:  Avoid high impact activities.   Exercise regularly.   Low impact exercises such as walking, biking and swimming help to keep the muscles strong and keep normal joint function.   Stretching helps to keep your range of motion.   Lose weight if you are overweight. This reduces joint stress.  In severe cases when you have pain at rest or increasing disability, joint surgery may be helpful. See your caregiver for follow-up treatment as recommended.  SEEK IMMEDIATE MEDICAL CARE IF:   You have severe joint pain.   Marked swelling and redness in your joint develops.   You develop a high fever.  Document Released: 12/08/2005 Document Revised: 11/27/2011 Document Reviewed: 05/10/2007 Exit[employer] Patient Information 2012 [employer].Hypertension As your heart beats, it forces blood through your arteries. This force is your blood pressure. If the pressure is too high, it is called hypertension (HTN) or high blood pressure. HTN is  dangerous because you may have it and not know it. High blood pressure may mean that your heart has to work harder to pump blood. Your arteries may be narrow or stiff. The extra work puts you at risk for heart disease, stroke, and other problems.  Blood pressure consists of two numbers, a higher number over a lower, 110/72, for example. It is stated as "110 over 72." The ideal is below 120 for the top number (systolic) and under 80 for the bottom (diastolic). Write down your blood pressure today. You should pay close attention to your blood pressure if you have certain conditions such as:  Heart failure.   Prior heart attack.   Diabetes   Chronic kidney disease.   Prior stroke.   Multiple risk factors for heart disease.  To see if you have HTN, your blood pressure should be measured while you are seated with your arm held at the level of the heart. It should be measured at least twice. A one-time elevated blood pressure reading (especially in the Emergency Department) does not mean that you need treatment. There may be conditions in which the blood pressure is different between your right and left arms. It is important to see your caregiver soon for a recheck. Most people have essential hypertension which means that there is not a specific cause. This type of high blood pressure may be lowered by changing lifestyle factors such as:  Stress.   Smoking.   Lack of exercise.   Excessive weight.   Drug/tobacco/alcohol use.   Eating less salt.  Most people do not have symptoms from high blood pressure until   it has caused damage to the body. Effective treatment can often prevent, delay or reduce that damage. TREATMENT  When a cause has been identified, treatment for high blood pressure is directed at the cause. There are a large number of medications to treat HTN. These fall into several categories, and your caregiver will help you select the medicines that are best for you. Medications may  have side effects. You should review side effects with your caregiver. If your blood pressure stays high after you have made lifestyle changes or started on medicines,   Your medication(s) may need to be changed.   Other problems may need to be addressed.   Be certain you understand your prescriptions, and know how and when to take your medicine.   Be sure to follow up with your caregiver within the time frame advised (usually within two weeks) to have your blood pressure rechecked and to review your medications.   If you are taking more than one medicine to lower your blood pressure, make sure you know how and at what times they should be taken. Taking two medicines at the same time can result in blood pressure that is too low.  SEEK IMMEDIATE MEDICAL CARE IF:  You develop a severe headache, blurred or changing vision, or confusion.   You have unusual weakness or numbness, or a faint feeling.   You have severe chest or abdominal pain, vomiting, or breathing problems.  MAKE SURE YOU:   Understand these instructions.   Will watch your condition.   Will get help right away if you are not doing well or get worse.  Document Released: 12/08/2005 Document Revised: 11/27/2011 Document Reviewed: 07/28/2008 Exit[employer] Patient Information 2012 [employer]. 

## 2012-03-18 NOTE — Assessment & Plan Note (Signed)
I will check his FLP TSH CMP today 

## 2012-03-18 NOTE — Assessment & Plan Note (Signed)
Plain films today show very mild DJD

## 2012-03-18 NOTE — Progress Notes (Signed)
Subjective:    Patient ID: Arthur Avila, male    DOB: 1976/05/20, 36 y.o.   MRN: [id number]  Cough This is a chronic problem. The current episode started more than 1 month ago. The problem has been unchanged. The problem occurs every few hours. The cough is non-productive. Associated symptoms include postnasal drip. Pertinent negatives include no chest pain, chills, ear congestion, ear pain, fever, headaches, heartburn, hemoptysis, myalgias, nasal congestion, rash, rhinorrhea, sore throat, shortness of breath, sweats, weight loss or wheezing. The symptoms are aggravated by other (lisinopril). He has tried OTC cough suppressant for the symptoms. The treatment provided no relief.  Arthritis Presents for initial visit. The disease course has been fluctuating. Onset time: 2. He complains of pain. He reports no stiffness, joint swelling or joint warmth. Affected locations include the right knee and left knee. His pain is at a severity of 3/10. Pertinent negatives include no diarrhea, dry eyes, dry mouth, dysuria, fatigue, fever, pain at night, pain while resting, rash, Raynaud's syndrome, uveitis or weight loss. His pertinent risk factors include overuse. Past treatments include acetaminophen. The treatment provided no relief. Factors aggravating his arthritis include climbing stairs.      Review of Systems  Constitutional: Negative for fever, chills, weight loss, diaphoresis, activity change, appetite change, fatigue and unexpected weight change.  HENT: Positive for postnasal drip. Negative for ear pain, sore throat and rhinorrhea.   Eyes: Negative.   Respiratory: Positive for cough. Negative for apnea, hemoptysis, choking, chest tightness, shortness of breath and wheezing.   Cardiovascular: Negative for chest pain, palpitations and leg swelling.  Gastrointestinal: Negative for heartburn, nausea, vomiting, abdominal pain, diarrhea and anal bleeding.  Genitourinary: Negative.  Negative for dysuria.   Musculoskeletal: Positive for arthralgias (both knees) and arthritis. Negative for myalgias, back pain, joint swelling, gait problem and stiffness.  Skin: Negative for color change, pallor and rash.  Neurological: Negative for dizziness, tremors, seizures, syncope, facial asymmetry, speech difficulty, weakness, light-headedness, numbness and headaches.  Hematological: Negative for adenopathy. Does not bruise/bleed easily.  Psychiatric/Behavioral: Negative.        Objective:   Physical Exam  Vitals reviewed. Constitutional: He is oriented to person, place, and time. He appears well-developed and well-nourished. No distress.  HENT:  Head: Normocephalic and atraumatic.  Mouth/Throat: Oropharynx is clear and moist. No oropharyngeal exudate.  Eyes: Conjunctivae are normal. Right eye exhibits no discharge. Left eye exhibits no discharge. No scleral icterus.  Neck: Normal range of motion. Neck supple. No JVD present. No tracheal deviation present. No thyromegaly present.  Cardiovascular: Normal rate, regular rhythm, normal heart sounds and intact distal pulses.  Exam reveals no gallop and no friction rub.   No murmur heard. Pulmonary/Chest: Effort normal and breath sounds normal. No stridor. No respiratory distress. He has no wheezes. He has no rales. He exhibits no tenderness.  Abdominal: Soft. Bowel sounds are normal. He exhibits no distension and no mass. There is no tenderness. There is no rebound and no guarding.  Musculoskeletal: Normal range of motion. He exhibits no edema and no tenderness.       Right knee: Normal. He exhibits normal range of motion, no swelling, no effusion, no ecchymosis, no deformity, no laceration, no erythema, normal alignment, no LCL laxity and normal patellar mobility. no tenderness found.       Left knee: He exhibits normal range of motion, no swelling, no effusion, no ecchymosis, no deformity, no laceration, no erythema, normal alignment, no LCL laxity and normal  patellar mobility.  no tenderness found.  Lymphadenopathy:    He has no cervical adenopathy.  Neurological: He is oriented to person, place, and time.  Skin: Skin is warm and dry. No rash noted. He is not diaphoretic. No erythema. No pallor.  Psychiatric: He has a normal mood and affect. His behavior is normal. Judgment and thought content normal.          Assessment & Plan:

## 2012-03-31 ENCOUNTER — Encounter (HOSPITAL_COMMUNITY): Payer: Self-pay | Admitting: Emergency Medicine

## 2012-03-31 ENCOUNTER — Emergency Department (HOSPITAL_COMMUNITY)
Admission: EM | Admit: 2012-03-31 | Discharge: 2012-03-31 | Disposition: A | Discharge status: Home / Self Care | Payer: BC Managed Care – PPO | Attending: Emergency Medicine | Admitting: Emergency Medicine

## 2012-03-31 ENCOUNTER — Emergency Department (HOSPITAL_COMMUNITY)
Admission: EM | Admit: 2012-03-31 | Discharge: 2012-03-31 | Discharge status: Against Medical Advice | Payer: BC Managed Care – PPO | Attending: Emergency Medicine | Admitting: Emergency Medicine

## 2012-03-31 ENCOUNTER — Encounter (HOSPITAL_COMMUNITY): Payer: Self-pay | Admitting: *Deleted

## 2012-03-31 DIAGNOSIS — L0231 Cutaneous abscess of buttock: Secondary | ICD-10-CM | POA: Insufficient documentation

## 2012-03-31 DIAGNOSIS — M545 Low back pain, unspecified: Secondary | ICD-10-CM | POA: Insufficient documentation

## 2012-03-31 DIAGNOSIS — L02219 Cutaneous abscess of trunk, unspecified: Secondary | ICD-10-CM | POA: Insufficient documentation

## 2012-03-31 DIAGNOSIS — I10 Essential (primary) hypertension: Secondary | ICD-10-CM | POA: Insufficient documentation

## 2012-03-31 DIAGNOSIS — L02212 Cutaneous abscess of back [any part, except buttock]: Secondary | ICD-10-CM

## 2012-03-31 MED ORDER — SULFAMETHOXAZOLE-TRIMETHOPRIM 800-160 MG PO TABS: 1.0000 | ORAL_TABLET | Freq: Two times a day (BID) | ORAL | Status: AC

## 2012-03-31 MED ORDER — LIDOCAINE HCL 2 % IJ SOLN
20.0000 mL | Freq: Once | INTRAMUSCULAR | Status: DC
  Filled 2012-03-31: qty 1

## 2012-03-31 MED ORDER — HYDROCODONE-ACETAMINOPHEN 5-325 MG PO TABS: 1.0000 | ORAL_TABLET | ORAL | Status: AC | PRN

## 2012-03-31 NOTE — ED Notes (Signed)
Pt states "I thought it was a bug bite"; pt presents with abscess to left buttock

## 2012-03-31 NOTE — Discharge Instructions (Signed)

## 2012-03-31 NOTE — ED Notes (Signed)
Pt presents with abscess on lower left back area. Pt states he first noted abscess on Monday. Pt states abscess is painful.

## 2012-03-31 NOTE — ED Provider Notes (Signed)
History     CSN: [id number]  Arrival date & time 03/31/12  4098   First MD Initiated Contact with Patient 03/31/12 2200      Chief Complaint  Patient presents with  . Abscess    (Consider location/radiation/quality/duration/timing/severity/associated sxs/prior treatment) HPI Comments: Patient here with left lower back abscess - has a history of same - states intially a small area, then increased, opened up and drained small amount - denies fever, chills, redness surrounding the area.  Patient is a 36 y.o. male presenting with abscess. The history is provided by the patient. No language interpreter was used.  Abscess  This is a new problem. The current episode started yesterday. The onset was gradual. The problem occurs frequently. The problem has been unchanged. Affected Location: left lower back. The problem is moderate. The abscess is characterized by redness, painfulness and draining. The patient was exposed to OTC medications. The abscess first occurred at home. Pertinent negatives include no anorexia, no decrease in physical activity, not sleeping less, not drinking less, no fever, no fussiness, not sleeping more, no diarrhea, no vomiting, no congestion, no rhinorrhea, no sore throat, no decreased responsiveness and no cough. His past medical history does not include skin abscesses in family. There were no sick contacts. He has received no recent medical care.    Past Medical History  Diagnosis Date  . Hypertension   . Morbid obesity     History reviewed. No pertinent past surgical history.  Family History  Problem Relation Age of Onset  . Cancer Neg Hx   . Heart disease Neg Hx   . Stroke Neg Hx   . Diabetes Other   . Hypertension Other     History  Substance Use Topics  . Smoking status: Never Smoker   . Smokeless tobacco: Never Used  . Alcohol Use: Yes     rarely      Review of Systems  Constitutional: Negative for fever and decreased responsiveness.  HENT:  Negative for congestion, sore throat and rhinorrhea.   Respiratory: Negative for cough.   Gastrointestinal: Negative for vomiting, diarrhea and anorexia.  All other systems reviewed and are negative.    Allergies  Lisinopril  Home Medications   Current Outpatient Rx  Name Route Sig Dispense Refill  . IBUPROFEN 400 MG PO TABS Oral Take 400 mg by mouth every 6 (six) hours as needed.    Marland Kitchen OLMESARTAN MEDOXOMIL 20 MG PO TABS Oral Take 1 tablet (20 mg total) by mouth daily. 56 tablet 0  . SODIUM & POTASSIUM BICARBONATE PO TBEF Oral Take 1 tablet by mouth daily as needed. For cold/flu symptom relief      BP 144/68  Pulse 88  Temp(Src) 98.4 F (36.9 C) (Oral)  Resp 18  SpO2 95%  Physical Exam  Nursing note and vitals reviewed. Constitutional: He is oriented to person, place, and time. He appears well-developed and well-nourished. No distress.  HENT:  Head: Normocephalic and atraumatic.  Right Ear: External ear normal.  Left Ear: External ear normal.  Nose: Nose normal.  Mouth/Throat: Oropharynx is clear and moist. No oropharyngeal exudate.  Eyes: Conjunctivae are normal. Pupils are equal, round, and reactive to light. No scleral icterus.  Neck: Normal range of motion. Neck supple.  Cardiovascular: Normal rate, regular rhythm and normal heart sounds.  Exam reveals no gallop and no friction rub.   No murmur heard. Pulmonary/Chest: Effort normal and breath sounds normal. No respiratory distress. He has no wheezes. He has no  rales. He exhibits no tenderness.  Abdominal: Soft. Bowel sounds are normal. He exhibits no distension. There is no tenderness.  Musculoskeletal: Normal range of motion. He exhibits no edema and no tenderness.  Lymphadenopathy:    He has no cervical adenopathy.  Neurological: He is alert and oriented to person, place, and time. No cranial nerve deficit.  Skin: Skin is warm and dry. No rash noted. There is erythema. No pallor.       1cm area of induration with  fluctuance - able to express small amount purulent drainage  Psychiatric: He has a normal mood and affect. His behavior is normal. Judgment and thought content normal.    ED Course  Procedures (including critical care time)  Labs Reviewed - No data to display No results found. INCISION AND DRAINAGE Performed by: Cherrie Distance C. Consent: Verbal consent obtained. Risks and benefits: risks, benefits and alternatives were discussed Type: abscess  Body area: left lower back  Anesthesia: local infiltration  Local anesthetic: lidocaine 2% without epinephrine  Anesthetic total: 2 ml  Complexity: complex Blunt dissection to break up loculations  Drainage: purulent  Drainage amount: moderate  Packing material: 1/4 in iodoform gauze  Patient tolerance: Patient tolerated the procedure well with no immediate complications.    Left lower back abscess    MDM  Patient here with left lower back abscess - tolerated I&D well - dressing placed - patient will return in 2 days for wound re-check.        Izola Price [place], [place] 03/31/12 [phone]

## 2012-03-31 NOTE — ED Notes (Signed)
Pt states he has an abscess on his lower back on the left side  Pt states not draining at this time  Sxs started on Monday

## 2012-04-01 NOTE — ED Provider Notes (Signed)
Medical screening examination/treatment/procedure(s) were performed by non-physician practitioner and as supervising physician I was immediately available for consultation/collaboration.    Irlene Crudup, MD 04/01/12 0012 

## 2012-04-23 ENCOUNTER — Encounter (HOSPITAL_COMMUNITY): Payer: Self-pay | Admitting: Emergency Medicine

## 2012-04-23 ENCOUNTER — Emergency Department (HOSPITAL_COMMUNITY)
Admission: EM | Admit: 2012-04-23 | Discharge: 2012-04-23 | Disposition: A | Discharge status: Home / Self Care | Payer: BC Managed Care – PPO | Attending: Emergency Medicine | Admitting: Emergency Medicine

## 2012-04-23 ENCOUNTER — Emergency Department (HOSPITAL_COMMUNITY): Payer: BC Managed Care – PPO

## 2012-04-23 DIAGNOSIS — M79609 Pain in unspecified limb: Secondary | ICD-10-CM | POA: Insufficient documentation

## 2012-04-23 DIAGNOSIS — S62319A Displaced fracture of base of unspecified metacarpal bone, initial encounter for closed fracture: Secondary | ICD-10-CM | POA: Insufficient documentation

## 2012-04-23 DIAGNOSIS — I10 Essential (primary) hypertension: Secondary | ICD-10-CM | POA: Insufficient documentation

## 2012-04-23 DIAGNOSIS — S62308A Unspecified fracture of other metacarpal bone, initial encounter for closed fracture: Secondary | ICD-10-CM

## 2012-04-23 MED ORDER — HYDROCODONE-ACETAMINOPHEN 5-325 MG PO TABS: 1.0000 | ORAL_TABLET | Freq: Four times a day (QID) | ORAL | Status: DC | PRN

## 2012-04-23 NOTE — ED Provider Notes (Signed)
Medical screening examination/treatment/procedure(s) were performed by non-physician practitioner and as supervising physician I was immediately available for consultation/collaboration.  Avis Tirone, MD 04/23/12 2359 

## 2012-04-23 NOTE — ED Notes (Signed)
Ortho tech at bedside 

## 2012-04-23 NOTE — Discharge Instructions (Signed)
Call Dr. Magnus Ivan on Monday for an appointment. He wants to see you in his office Monday or Tuesday. Return here as needed.
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
## 2012-04-28 ENCOUNTER — Encounter: Payer: Self-pay | Admitting: Physician Assistant

## 2012-04-28 ENCOUNTER — Ambulatory Visit (INDEPENDENT_AMBULATORY_CARE_PROVIDER_SITE_OTHER): Payer: BC Managed Care – PPO | Admitting: Physician Assistant

## 2012-04-28 VITALS — BP 131/86 | HR 79 | Ht 74.0 in | Wt 392.0 lb

## 2012-04-28 DIAGNOSIS — Z01810 Encounter for preprocedural cardiovascular examination: Secondary | ICD-10-CM

## 2012-04-28 DIAGNOSIS — R079 Chest pain, unspecified: Secondary | ICD-10-CM

## 2012-04-28 DIAGNOSIS — I10 Essential (primary) hypertension: Secondary | ICD-10-CM
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
## 2012-04-28 NOTE — Patient Instructions (Signed)
Your physician has requested that you have an exercise tolerance test DX 786.50 THIS NEEDS TO BE DONE THIS WEEK DUE TO PT HAVING SURGERY PER SCOTT WEAVER, PAC. For further information please visit [email or web address] Please also follow instruction sheet, as given.  NO CHANGES WERE MADE TODAY

## 2012-04-30 ENCOUNTER — Ambulatory Visit (INDEPENDENT_AMBULATORY_CARE_PROVIDER_SITE_OTHER): Payer: BC Managed Care – PPO | Admitting: Physician Assistant

## 2012-04-30 ENCOUNTER — Other Ambulatory Visit (HOSPITAL_COMMUNITY): Payer: Self-pay | Admitting: Orthopaedic Surgery

## 2012-04-30 ENCOUNTER — Encounter: Payer: Self-pay | Admitting: Physician Assistant

## 2012-04-30 DIAGNOSIS — I10 Essential (primary) hypertension: Secondary | ICD-10-CM

## 2012-04-30 DIAGNOSIS — R079 Chest pain, unspecified: Secondary | ICD-10-CM

## 2012-04-30 NOTE — Procedures (Signed)
Exercise Treadmill Test  Pre-Exercise Testing Evaluation Rhythm: normal sinus  Rate: 77   PR:  .15 QRS:  .10  QT:  .36 QTc: .41     Test  Exercise Tolerance Test Ordering MD: Charlton Haws, MD  Interpreting MD: Tereso Newcomer PA-C  Unique Test No: 1  Treadmill:  1  Indication for ETT: HTN  Contraindication to ETT: No   Stress Modality: exercise - treadmill  Cardiac Imaging Performed: non   Protocol: standard Bruce - maximal  Max BP:  181/41  Max MPHR (bpm):  184 85% MPR (bpm):  156  MPHR obtained (bpm):  166 % MPHR obtained:  90%  Reached 85% MPHR (min:sec):  4:08 Total Exercise Time (min-sec):  6:00  Workload in METS:  7.0 Borg Scale: 15  Reason ETT Terminated:  patient's desire to stop    ST Segment Analysis At Rest: non-specific ST segment slurring With Exercise: no evidence of significant ST depression  Other Information Arrhythmia:  No Angina during ETT:  absent (0) Quality of ETT:  diagnostic  ETT Interpretation:  normal - no evidence of ischemia by ST analysis  Comments: Poor exercise tolerance. No chest pain. Normal BP response to exercise. No ST-T changes to suggest ischemia.   Recommendations: Patient may proceed with non-cardiac surgery without further testing. He is at acceptable risk. Follow up with Dr. Charlton Haws as directed. Tereso Newcomer, PA-C  12:32 PM 04/30/2012

## 2012-05-03 ENCOUNTER — Other Ambulatory Visit (HOSPITAL_COMMUNITY): Payer: Self-pay | Admitting: Orthopaedic Surgery

## 2012-05-05 ENCOUNTER — Encounter (HOSPITAL_COMMUNITY): Payer: Self-pay | Admitting: Pharmacy Technician

## 2012-05-05 ENCOUNTER — Inpatient Hospital Stay (HOSPITAL_COMMUNITY): Admission: RE | Admit: 2012-05-05 | Payer: BC Managed Care – PPO | Source: Ambulatory Visit

## 2012-05-06 ENCOUNTER — Ambulatory Visit (HOSPITAL_COMMUNITY)
Admission: RE | Admit: 2012-05-06 | Payer: BC Managed Care – PPO | Source: Ambulatory Visit | Admitting: Orthopaedic Surgery

## 2012-05-06 ENCOUNTER — Encounter (HOSPITAL_COMMUNITY): Admission: RE | Payer: Self-pay | Source: Ambulatory Visit

## 2012-05-06 SURGERY — OPEN REDUCTION INTERNAL FIXATION (ORIF) METACARPAL (FINGER) FRACTURE
Anesthesia: General | Laterality: Right
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
## 2014-05-13 ENCOUNTER — Emergency Department (HOSPITAL_COMMUNITY)
Admission: EM | Admit: 2014-05-13 | Discharge: 2014-05-13 | Disposition: A | Discharge status: Home / Self Care | Payer: Managed Care, Other (non HMO) | Source: Home / Self Care | Attending: Family Medicine | Admitting: Family Medicine

## 2014-05-13 ENCOUNTER — Encounter (HOSPITAL_COMMUNITY): Payer: Self-pay | Admitting: Emergency Medicine

## 2014-05-13 DIAGNOSIS — K089 Disorder of teeth and supporting structures, unspecified: Secondary | ICD-10-CM

## 2014-05-13 DIAGNOSIS — K0889 Other specified disorders of teeth and supporting structures: Secondary | ICD-10-CM

## 2014-05-13 MED ORDER — CLINDAMYCIN HCL 300 MG PO CAPS: 300.0000 mg | ORAL_CAPSULE | Freq: Three times a day (TID) | ORAL | Status: DC

## 2014-05-13 MED ORDER — IBUPROFEN 800 MG PO TABS: 800.0000 mg | ORAL_TABLET | Freq: Three times a day (TID) | ORAL | Status: DC

## 2014-05-13 NOTE — ED Notes (Signed)
Pt comes in with c/o sharp,throbbing toothache on both sides that started last Wednesday. Pt has tried Ibuprofen for pain  States he went to Dentist but no treatment done Intermit swelling reported

## 2014-05-13 NOTE — Discharge Instructions (Signed)
Take medicine as prescribed, rinse with warm salt water 3 times a day, see your dentist as soon as possible

## 2014-05-13 NOTE — ED Provider Notes (Signed)
CSN: [id number]     Arrival date & time 05/13/14  1202 History   First MD Initiated Contact with Patient 05/13/14 1302     Chief Complaint  Patient presents with  . Dental Pain   (Consider location/radiation/quality/duration/timing/severity/associated sxs/prior Treatment) Patient is a 38 y.o. male presenting with tooth pain. The history is provided by the patient.  Dental Pain Location:  Lower Lower teeth location:  17/LL 3rd molar Quality:  Throbbing Severity:  Moderate Onset quality:  Gradual Duration:  4 days Progression:  Worsening Chronicity:  Recurrent Context: dental caries   Associated symptoms: facial swelling and neck pain   Associated symptoms: no fever     Past Medical History  Diagnosis Date  . Hypertension   . Morbid obesity    History reviewed. No pertinent past surgical history. Family History  Problem Relation Age of Onset  . Cancer Neg Hx   . Heart disease Neg Hx   . Stroke Neg Hx   . Diabetes Other   . Hypertension Other    History  Substance Use Topics  . Smoking status: Never Smoker   . Smokeless tobacco: Never Used  . Alcohol Use: Yes     Comment: rarely    Review of Systems  Constitutional: Negative.  Negative for fever.  HENT: Positive for dental problem and facial swelling.   Musculoskeletal: Positive for neck pain.  Hematological: Positive for adenopathy.    Allergies  Lisinopril  Home Medications   Prior to Admission medications   Medication Sig Start Date End Date Taking? Authorizing Provider  olmesartan (BENICAR) 20 MG tablet Take 20 mg by mouth daily with breakfast. 03/18/12 03/18/13  Etta Grandchild, MD   BP 153/94  Pulse 83  Temp(Src) 98.8 F (37.1 C) (Oral)  Resp 18  SpO2 95% Physical Exam  Nursing note and vitals reviewed. Constitutional: He appears well-developed and well-nourished.  HENT:  Head: Normocephalic.  Right Ear: External ear normal.  Left Ear: External ear normal.  Mouth/Throat: Oropharynx is clear  and moist.      ED Course  Procedures (including critical care time) Labs Review Labs Reviewed - No data to display  Imaging Review No results found.   MDM   1. Pain, dental        Linna Hoff, MD 05/13/14 1330

## 2014-10-19 ENCOUNTER — Emergency Department (INDEPENDENT_AMBULATORY_CARE_PROVIDER_SITE_OTHER)
Admission: EM | Admit: 2014-10-19 | Discharge: 2014-10-19 | Disposition: A | Discharge status: Home / Self Care | Payer: Managed Care, Other (non HMO) | Source: Home / Self Care | Attending: Family Medicine | Admitting: Family Medicine

## 2014-10-19 ENCOUNTER — Encounter (HOSPITAL_COMMUNITY): Payer: Self-pay | Admitting: Emergency Medicine

## 2014-10-19 DIAGNOSIS — L0201 Cutaneous abscess of face: Secondary | ICD-10-CM

## 2014-10-19 MED ORDER — SULFAMETHOXAZOLE-TRIMETHOPRIM 800-160 MG PO TABS: 2.0000 | ORAL_TABLET | Freq: Two times a day (BID) | ORAL | Status: DC

## 2014-10-19 MED ORDER — LIDOCAINE-EPINEPHRINE (PF) 2 %-1:200000 IJ SOLN
INTRAMUSCULAR | Status: AC
  Filled 2014-10-19: qty 20

## 2014-10-19 NOTE — ED Provider Notes (Signed)
Arthur MessingMark J Avila is a 38 y.o. male who presents to Urgent Care today for facial abscess. Patient notes pain and swelling of the left side of his face starting about a few days ago. It is similar episode a year ago. He's tried warm compresses and tried to express pus. His symptoms have worsened. No fevers or chills nausea vomiting or diarrhea. No medications tried yet.   Past Medical History  Diagnosis Date  . Hypertension   . Morbid obesity    History  Substance Use Topics  . Smoking status: Never Smoker   . Smokeless tobacco: Never Used  . Alcohol Use: Yes     Comment: rarely   ROS as above Medications: No current facility-administered medications for this encounter.   Current Outpatient Prescriptions  Medication Sig Dispense Refill  . sulfamethoxazole-trimethoprim (SEPTRA DS) 800-160 MG per tablet Take 2 tablets by mouth 2 (two) times daily.  28 tablet  0  . [DISCONTINUED] olmesartan (BENICAR) 20 MG tablet Take 20 mg by mouth daily with breakfast.        Exam:  BP 150/86  Pulse 68  Temp(Src) 98.3 F (36.8 C) (Oral)  Resp 16  SpO2 98% Gen: Well NAD morbidly obese HEENT: ,  MMM no oral lesions Lungs: Normal work of breathing. CTABL Heart: RRR no MRG Abd: NABS, Soft. Nondistended, Nontender Exts: Brisk capillary refill, warm and well perfused.  Skin: Left face at the jaw 3 cm indurated tender erythematous area with central fluctuance. No induration or pain extending towards the neck  Abscess incision and drainage. Consent obtained and timeout performed. Skin cleaned with alcohol, 2 mL of lidocaine with epinephrine injected achieving good anesthesia. Skin again cleaned with alcohol. Sharp incision was made to the area of fluctuance. Pus expressed. Blunt dissection used to break up loculations. Patient tolerated the procedure well. A dressing was applied.   No results found for this or any previous visit (from the past 24 hour(s)). No results found.  Assessment and  Plan: 38 y.o. male with left facial abscess status post incision and drainage. Treatment with Bactrim. Follow-up as needed.  Discussed warning signs or symptoms. Please see discharge instructions. Patient expresses understanding.     Rodolph BongEvan S Leyland Kenna, MD 10/19/14 [phone]

## 2014-10-19 NOTE — ED Notes (Signed)
Pt states that he has abcess on face that appeared in the same place about 1 year ago. Pt states he tried to drain it this morning but it began to swell more. Pt denies any pain at this time. Pt in no acute distress at this time.

## 2014-10-19 NOTE — Discharge Instructions (Signed)
Thank you for coming in today. Take Bactrim twice daily for 1 week Follow-up with a primary care provider PRIMARY CARE Merchant navy officerDOCTORS [facility] at [facility] [address]  [place], [place] [phone]  Fax [phone]  [facility] [address]  [place], [place] [phone]  Fax [phone]  [facility] at [place] / [place] [id number]  [place], [place] [phone]  Fax [phone]  [facility] [address]  [place], [place] [phone]  Fax [phone]  [facility] [place] [address] [place]  [place], [place] [phone]  Fax [phone]  [facility] at [facility] [address] [place]  [place], [place] [phone]  Fax [phone]   [facility] [place] [address] [place] [address] [place] [address] Phone: [phone]   [facility] @ [facility] [address] [place] [address] Phone: [phone]   [facility] @ [place] [address] [place] [address] Phone: [phone]   [facility] @ [place] [address] [place] [place] [address] Phone: [phone]   [facility] @ [place] [address] [place] [address] Phone: [phone] Fax: [phone]   [facility] @ [place] [address] [place] [address] Phone: [phone]   Dr. Maryelizabeth RowanElizabeth Dewey [address] [place] [address] [phone]   Abscess Care After An abscess (also called a boil or furuncle) is an infected area that contains a collection of pus. Signs and symptoms of an abscess include pain, tenderness, redness, or hardness, or you may feel a moveable soft area under your skin. An abscess can occur anywhere in the  body. The infection may spread to surrounding tissues causing cellulitis. A cut (incision) by the surgeon was made over your abscess and the pus was drained out. Gauze may have been packed into the space to provide a drain that will allow the cavity to heal from the inside outwards. The boil may be painful for 5 to 7 days. Most people with a boil do not have high fevers. Your abscess, if seen early, may not have localized, and may not have been lanced. If not, another appointment may be required for this if it does not get better on its own or with medications. HOME CARE INSTRUCTIONS   Only take over-the-counter or prescription medicines for pain, discomfort, or fever as directed by your caregiver.  When you bathe, soak and then remove gauze or iodoform packs at least daily or as directed by your caregiver. You may then wash the wound gently with mild soapy water. Repack with gauze or do as your caregiver directs. SEEK IMMEDIATE MEDICAL CARE IF:   You develop increased pain, swelling, redness, drainage, or bleeding in the wound site.  You develop signs of generalized infection including muscle aches, chills, fever, or a general ill feeling.  An oral temperature above 102 F (38.9 C) develops, not controlled by medication. See your caregiver for a recheck if you develop any of the symptoms described above. If medications (antibiotics) were prescribed, take them as directed. Document Released: 06/26/2005 Document Revised: 03/01/2012 Document Reviewed: 02/21/2008 [facility] Patient Information 2015 [employer], [place]. This information is not intended to replace advice given to you by your  health care provider. Make sure you discuss any questions you have with your health care provider. ° °

## 2019-07-21 DIAGNOSIS — R030 Elevated blood-pressure reading, without diagnosis of hypertension: Secondary | ICD-10-CM | POA: Diagnosis not present

## 2019-07-27 DIAGNOSIS — R7309 Other abnormal glucose: Secondary | ICD-10-CM | POA: Diagnosis not present

## 2019-07-27 DIAGNOSIS — R030 Elevated blood-pressure reading, without diagnosis of hypertension: Secondary | ICD-10-CM | POA: Diagnosis not present

## 2019-07-28 DIAGNOSIS — I10 Essential (primary) hypertension: Secondary | ICD-10-CM | POA: Diagnosis not present

## 2019-07-28 DIAGNOSIS — E1165 Type 2 diabetes mellitus with hyperglycemia: Secondary | ICD-10-CM | POA: Diagnosis not present

## 2019-07-28 DIAGNOSIS — E119 Type 2 diabetes mellitus without complications: Secondary | ICD-10-CM | POA: Diagnosis not present

## 2019-08-18 DIAGNOSIS — E119 Type 2 diabetes mellitus without complications: Secondary | ICD-10-CM | POA: Diagnosis not present

## 2019-09-22 DIAGNOSIS — I10 Essential (primary) hypertension: Secondary | ICD-10-CM | POA: Diagnosis not present

## 2019-09-22 DIAGNOSIS — E119 Type 2 diabetes mellitus without complications: Secondary | ICD-10-CM | POA: Diagnosis not present

## 2019-10-20 ENCOUNTER — Encounter: Payer: BC Managed Care – PPO | Attending: Family Medicine | Admitting: Registered"

## 2019-10-20 ENCOUNTER — Encounter: Payer: Self-pay | Admitting: Registered"

## 2019-10-20 ENCOUNTER — Other Ambulatory Visit: Payer: Self-pay

## 2019-10-20 DIAGNOSIS — E119 Type 2 diabetes mellitus without complications: Secondary | ICD-10-CM | POA: Insufficient documentation

## 2019-10-20 NOTE — Progress Notes (Signed)
Diabetes Self-Management Education  Visit Type: First/Initial  Appt. Start Time: 1605 Appt. End Time: 3419  10/21/2019  [name], identified by name and date of birth, is a 43 y.o. male with a diagnosis of Diabetes: Type 2.   ASSESSMENT  There were no vitals taken for this visit. There is no height or weight on file to calculate BMI.   Pt states when he was working with surgeon for rotator cuff surgery (Sept 21, 2020. Follow-up Nov 5th) the surgeon asked how he is sleeping and patient told him he barely sleeps at night d/t urination, that is when the DM was discovered and patient followed up with a PCP for A1c test and treatment.   Sleep: pt reports he gets very little sleep and has been that way for awhile, started before the increased urination.  Pt states he wants to be around for his 62 yr old son and motivated to get this under control.  Pt reports drinking sweetened beverages several times per day. Pt states he has given up sodas in the past and is confident he can do it again. Pt has increased walking. Pt states he is aware of the many complications that can come with diabetes. Pt states his significant other is also having some health issues.  Health maintenance: Pt states he had some teeth pulled recently but wasn't sure if or when he should return to dentist. Pt states he had a recent eye exam but doesn't think he had the dilated exam.  Diabetes Self-Management Education - 10/20/19 1612      Visit Information   Visit Type  First/Initial      Initial Visit   Diabetes Type  Type 2    Are you currently following a meal plan?  No    Are you taking your medications as prescribed?  Yes   metformin 500 with each meal   Date Diagnosed  07/27/2019      Health Coping   How would you rate your overall health?  Poor      Psychosocial Assessment   Patient Belief/Attitude about Diabetes  Motivated to manage diabetes   cycling through all the attitutdes   How often do  you need to have someone help you when you read instructions, pamphlets, or other written materials from your doctor or pharmacy?  1 - Never    What is the last grade level you completed in school?  some college      Complications   Last HgB A1C per patient/outside source  16.6 %    How often do you check your blood sugar?  1-2 times/day    Fasting Blood glucose range (mg/dL)  --   146 to >300   Have you had a dilated eye exam in the past 12 months?  No   vision without dilated eye   Have you had a dental exam in the past 12 months?  Yes    Are you checking your feet?  No      Dietary Intake   Breakfast  bojangles egg, pork, cheese, sweet tea or cheer wine    Snack (morning)  none    Lunch  sub with everything, chips OR buger, fries    Snack (afternoon)  none    Dinner  shepherds pie, cauliflower, creamed corn OR chinese, mongolian beef, rice    Snack (evening)  none    Beverage(s)  water, sweet tea, cheerwine      Exercise   Exercise Type  Light (walking / raking leaves)    How many days per week to you exercise?  3    How many minutes per day do you exercise?  3    Total minutes per week of exercise  9      Patient Education   Previous Diabetes Education  No    Disease state   Definition of diabetes, type 1 and 2, and the diagnosis of diabetes    Nutrition management   Role of diet in the treatment of diabetes and the relationship between the three main macronutrients and blood glucose level    Physical activity and exercise   Role of exercise on diabetes management, blood pressure control and cardiac health.    Medications  Reviewed patients medication for diabetes, action, purpose, timing of dose and side effects.    Monitoring  Yearly dilated eye exam    Chronic complications  Assessed and discussed foot care and prevention of foot problems      Individualized Goals (developed by patient)   Nutrition  General guidelines for healthy choices and portions discussed     Physical Activity  Exercise 3-5 times per week    Reducing Risk  Other (comment)   work on better sleep     Outcomes   Expected Outcomes  Demonstrated interest in learning. Expect positive outcomes    Future DMSE  PRN    Program Status  Completed       Individualized Plan for Diabetes Self-Management Training:   Learning Objective:  Patient will have a greater understanding of diabetes self-management. Patient education plan is to attend individual and/or group sessions per assessed needs and concerns.    Patient Instructions  Your Goals to help lower blood sugar:  Increase activity.  Continue walking after dropping son off at bus stop. Start walking at your lunch break 15-20 min, while listening to music (~5 songs)  Rethink you drink: switch out the tea & soda for water  Sleep: set the light filter on your phone. Read through the Sleep Hygiene handout.   Expected Outcomes:  Demonstrated interest in learning. Expect positive outcomes  Education material provided: ADA - How to Thrive: A Guide for Your Journey with Diabetes, A1C conversion sheet and Carbohydrate counting sheet, Sleep Hygiene  If problems or questions, patient to contact team via:  Phone and Email  Future DSME appointment: PRN

## 2019-10-20 NOTE — Patient Instructions (Addendum)
Your Goals to help lower blood sugar:  Increase activity.  Continue walking after dropping son off at bus stop. Start walking at your lunch break 15-20 min, while listening to music (~5 songs)  Rethink you drink: switch out the tea & soda for water  Sleep: set the light filter on your phone. Read through the Sleep Hygiene handout.

## 2019-11-11 DIAGNOSIS — Z6841 Body Mass Index (BMI) 40.0 and over, adult: Secondary | ICD-10-CM | POA: Diagnosis not present

## 2019-11-11 DIAGNOSIS — Z0000 Encounter for general adult medical examination without abnormal findings: Secondary | ICD-10-CM | POA: Diagnosis not present

## 2019-11-11 DIAGNOSIS — E119 Type 2 diabetes mellitus without complications: Secondary | ICD-10-CM | POA: Diagnosis not present

## 2019-12-24 DIAGNOSIS — E119 Type 2 diabetes mellitus without complications: Secondary | ICD-10-CM | POA: Diagnosis not present

## 2020-04-11 ENCOUNTER — Encounter (HOSPITAL_COMMUNITY): Payer: Self-pay | Admitting: Emergency Medicine

## 2020-04-11 ENCOUNTER — Emergency Department (HOSPITAL_COMMUNITY): Payer: BC Managed Care – PPO

## 2020-04-11 ENCOUNTER — Emergency Department (HOSPITAL_COMMUNITY)
Admission: EM | Admit: 2020-04-11 | Discharge: 2020-04-11 | Disposition: A | Discharge status: Home / Self Care | Payer: BC Managed Care – PPO | Attending: Emergency Medicine | Admitting: Emergency Medicine

## 2020-04-11 ENCOUNTER — Other Ambulatory Visit: Payer: Self-pay

## 2020-04-11 DIAGNOSIS — G4700 Insomnia, unspecified: Secondary | ICD-10-CM | POA: Insufficient documentation

## 2020-04-11 DIAGNOSIS — I10 Essential (primary) hypertension: Secondary | ICD-10-CM | POA: Insufficient documentation

## 2020-04-11 DIAGNOSIS — Z79899 Other long term (current) drug therapy: Secondary | ICD-10-CM | POA: Diagnosis not present

## 2020-04-11 DIAGNOSIS — Z7984 Long term (current) use of oral hypoglycemic drugs: Secondary | ICD-10-CM | POA: Insufficient documentation

## 2020-04-11 DIAGNOSIS — E119 Type 2 diabetes mellitus without complications: Secondary | ICD-10-CM | POA: Diagnosis not present

## 2020-04-11 DIAGNOSIS — R0789 Other chest pain: Secondary | ICD-10-CM | POA: Insufficient documentation

## 2020-04-11 DIAGNOSIS — R079 Chest pain, unspecified: Secondary | ICD-10-CM | POA: Diagnosis not present

## 2020-04-11 HISTORY — DX: Type 2 diabetes mellitus without complications: E11.9

## 2020-04-11 LAB — TROPONIN I (HIGH SENSITIVITY)
Troponin I (High Sensitivity): 2 ng/L (ref <18)
Troponin I (High Sensitivity): <2 ng/L (ref <18)

## 2020-04-11 LAB — LIPID PANEL
Cholesterol: 143 mg/dL (ref 0–200)
HDL: 34 mg/dL — ABNORMAL LOW (ref >40)
LDL Cholesterol: 95 mg/dL (ref 0–99)
Total CHOL/HDL Ratio: 4.2 RATIO
Triglycerides: 71 mg/dL (ref <150)
VLDL: 14 mg/dL (ref 0–40)

## 2020-04-11 LAB — BASIC METABOLIC PANEL
Anion gap: 10 (ref 5–15)
BUN: 14 mg/dL (ref 6–20)
CO2: 24 mmol/L (ref 22–32)
Calcium: 9 mg/dL (ref 8.9–10.3)
Chloride: 101 mmol/L (ref 98–111)
Creatinine, Ser: 0.95 mg/dL (ref 0.61–1.24)
GFR calc Af Amer: >60 mL/min (ref >60)
GFR calc non Af Amer: >60 mL/min (ref >60)
Glucose, Bld: 290 mg/dL — ABNORMAL HIGH (ref 70–99)
Potassium: 4.7 mmol/L (ref 3.5–5.1)
Sodium: 135 mmol/L (ref 135–145)

## 2020-04-11 LAB — CBC
HCT: 49.4 % (ref 39.0–52.0)
Hemoglobin: 16.8 g/dL (ref 13.0–17.0)
MCH: 29.8 pg (ref 26.0–34.0)
MCHC: 34 g/dL (ref 30.0–36.0)
MCV: 87.6 fL (ref 80.0–100.0)
Platelets: 240 10*3/uL (ref 150–400)
RBC: 5.64 MIL/uL (ref 4.22–5.81)
RDW: 11.9 % (ref 11.5–15.5)
WBC: 3.5 10*3/uL — ABNORMAL LOW (ref 4.0–10.5)
nRBC: 0 % (ref 0.0–0.2)

## 2020-04-11 MED ORDER — ZOLPIDEM TARTRATE ER 6.25 MG PO TBCR: 6.2500 mg | EXTENDED_RELEASE_TABLET | Freq: Every evening | ORAL | 0 refills | Status: AC | PRN

## 2020-04-11 MED ORDER — SODIUM CHLORIDE 0.9% FLUSH: 3.0000 mL | Freq: Once | INTRAVENOUS | Status: DC

## 2020-04-11 NOTE — ED Notes (Signed)
Discharge paperwork and prescription reviewed with pt. Pt with no questions or concerns at this time, ambulatory at time of discharge, NAD.

## 2020-04-11 NOTE — ED Provider Notes (Signed)
[facility] DEPT Provider Note   CSN: [id number] Arrival date & time: 04/11/20  1100     History Chief Complaint  Patient presents with  . Chest Pain    Arthur Avila is a 44 y.o. male.  Pt presents to the ED today with cp.  Pt said it started last week, but is becoming more frequent.  Pt did see his pcp today who recommended that he come to the ED.  The pt has never had CAD, but has several risk factors (DM, HTN, high cholesterol, obesity).  Pt denies any other sx.  He has not had any of the Covid vaccine.  Pt has been fasting today for his doctor's visit.  He has not taken any of his meds.        Past Medical History:  Diagnosis Date  . Diabetes mellitus without complication ([facility])   . Hypertension   . Morbid obesity [facility])     Patient Active Problem List   Diagnosis Date Noted  . Knee pain, bilateral 03/18/2012  . Pure hypercholesterolemia 03/18/2012  . DJD (degenerative joint disease) of knee 03/18/2012  . MORBID OBESITY 10/05/2009  . HYPERTENSION 10/05/2009    History reviewed. No pertinent surgical history.     Family History  Problem Relation Age of Onset  . Diabetes Other   . Hypertension Other   . Cancer Neg Hx   . Heart disease Neg Hx   . Stroke Neg Hx     Social History   Tobacco Use  . Smoking status: Never Smoker  . Smokeless tobacco: Never Used  Substance Use Topics  . Alcohol use: Yes    Comment: rarely  . Drug use: No    Home Medications Prior to Admission medications   Medication Sig Start Date End Date Taking? Authorizing Provider  losartan (COZAAR) 25 MG tablet Take 25 mg by mouth daily.   Yes [provider]  metFORMIN (GLUCOPHAGE) 500 MG tablet Take 500 mg by mouth 2 (two) times daily with a meal.   Yes [provider]  pravastatin (PRAVACHOL) 10 MG tablet Take 10 mg by mouth daily.   Yes [provider]  sulfamethoxazole-trimethoprim (SEPTRA DS) 800-160 MG per tablet  Take 2 tablets by mouth 2 (two) times daily. Patient not taking: Reported on 04/11/2020 10/19/14   Gregor Hams, MD  zolpidem (AMBIEN CR) 6.25 MG CR tablet Take 1 tablet (6.25 mg total) by mouth at bedtime as needed for sleep. 04/11/20   Isla Pence, MD  olmesartan (BENICAR) 20 MG tablet Take 20 mg by mouth daily with breakfast. 03/18/12 10/19/14  Janith Lima, MD    Allergies    Lisinopril  Review of Systems   Review of Systems  Cardiovascular: Positive for chest pain.  All other systems reviewed and are negative.   Physical Exam Updated Vital Signs BP 134/86 (BP Location: Right Arm)   Pulse 60   Temp 98.4 F (36.9 C) (Oral)   Resp 18   SpO2 98%   Physical Exam Vitals and nursing note reviewed.  Constitutional:      Appearance: He is well-developed. He is obese.  HENT:     Head: Normocephalic and atraumatic.  Eyes:     Extraocular Movements: Extraocular movements intact.     Pupils: Pupils are equal, round, and reactive to light.  Cardiovascular:     Rate and Rhythm: Normal rate and regular rhythm.     Heart sounds: Normal heart sounds.  Pulmonary:  Effort: Pulmonary effort is normal.     Breath sounds: Normal breath sounds.  Abdominal:     General: Bowel sounds are normal.     Palpations: Abdomen is soft.  Musculoskeletal:        General: Normal range of motion.     Cervical back: Normal range of motion and neck supple.  Skin:    General: Skin is warm.     Capillary Refill: Capillary refill takes less than 2 seconds.  Neurological:     General: No focal deficit present.     Mental Status: He is alert and oriented to person, place, and time.  Psychiatric:        Mood and Affect: Mood normal.        Behavior: Behavior normal.     ED Results / Procedures / Treatments   Labs (all labs ordered are listed, but only abnormal results are displayed) Labs Reviewed  BASIC METABOLIC PANEL - Abnormal; Notable for the following components:      Result Value     Glucose, Bld 290 (*)    All other components within normal limits  CBC - Abnormal; Notable for the following components:   WBC 3.5 (*)    All other components within normal limits  LIPID PANEL - Abnormal; Notable for the following components:   HDL 34 (*)    All other components within normal limits  TROPONIN I (HIGH SENSITIVITY)  TROPONIN I (HIGH SENSITIVITY)    EKG EKG Interpretation  Date/Time:  Wednesday April 11 2020 11:06:46 EDT Ventricular Rate:  70 PR Interval:    QRS Duration: 96 QT Interval:  392 QTC Calculation: 423 R Axis:   -57 Text Interpretation: Sinus arrhythmia Inferior infarct, old Consider anterior infarct Confirmed by Jacalyn Lefevre [phone]) on 04/11/2020 11:33:30 AM   Radiology DG Chest 2 View  Result Date: 04/11/2020 CLINICAL DATA:  Intermittent chest pain for 1 month, worsening this week. EXAM: CHEST - 2 VIEW COMPARISON:  08/01/2010 FINDINGS: Normal heart, mediastinum and hila. Lungs are clear. No pleural effusion or pneumothorax. Skeletal structures are unremarkable. IMPRESSION: Normal chest radiographs. Electronically Signed   By: Amie Portland M.D.   On: 04/11/2020 11:47    Procedures Procedures (including critical care time)  Medications Ordered in ED Medications - No data to display  ED Course  I have reviewed the triage vital signs and the nursing notes.  Pertinent labs & imaging results that were available during my care of the patient were reviewed by me and considered in my medical decision making (see chart for details).    MDM Rules/Calculators/A&P                      Pt has several risk factors, but cp does not sound typical.  It has been going on for over a week with neg troponins.  Total and LDL cholesterol are nl.  HDL is low.  Pt is told to try to increase the HDL with healthier foods and exercise.  EKG is not perfectly normal, but it is not showing any signs of STEMI.  Pt will be referred to cards.  He c/o insomnia as  well.  He's been trying melatonin without improvement in sleep.  I will give him a rx for a few ambien.   Pt knows to return if worse.  Final Clinical Impression(s) / ED Diagnoses Final diagnoses:  Atypical chest pain  Insomnia, unspecified type    Rx / DC Orders ED Discharge  Orders         Ordered    Ambulatory referral to Cardiology     04/11/20 1419    zolpidem (AMBIEN CR) 6.25 MG CR tablet  At bedtime PRN     04/11/20 1420           Jacalyn Lefevre, MD 04/11/20 1423

## 2020-04-11 NOTE — ED Triage Notes (Signed)
Pt reports that for a week had intermittent left sided chest pains that radiates to his back. Reports that started getting lightheaded with it. Was sent from PCP for further eval.

## 2020-04-11 NOTE — ED Notes (Signed)
Pt ambulatory from triage 

## 2020-04-19 ENCOUNTER — Other Ambulatory Visit: Payer: Self-pay

## 2020-04-19 ENCOUNTER — Ambulatory Visit (INDEPENDENT_AMBULATORY_CARE_PROVIDER_SITE_OTHER): Payer: BC Managed Care – PPO | Admitting: Cardiology

## 2020-04-19 ENCOUNTER — Encounter: Payer: Self-pay | Admitting: Cardiology

## 2020-04-19 ENCOUNTER — Telehealth: Payer: Self-pay | Admitting: *Deleted

## 2020-04-19 VITALS — BP 118/74 | HR 83 | Ht 73.0 in | Wt 324.6 lb

## 2020-04-19 DIAGNOSIS — R079 Chest pain, unspecified: Secondary | ICD-10-CM | POA: Diagnosis not present

## 2020-04-19 DIAGNOSIS — E7800 Pure hypercholesterolemia, unspecified: Secondary | ICD-10-CM | POA: Diagnosis not present

## 2020-04-19 DIAGNOSIS — G4719 Other hypersomnia: Secondary | ICD-10-CM

## 2020-04-19 DIAGNOSIS — R072 Precordial pain: Secondary | ICD-10-CM

## 2020-04-19 DIAGNOSIS — I10 Essential (primary) hypertension: Secondary | ICD-10-CM

## 2020-04-19 MED ORDER — METOPROLOL TARTRATE 100 MG PO TABS: 100.0000 mg | ORAL_TABLET | Freq: Once | ORAL | 0 refills | Status: DC

## 2020-04-19 NOTE — Progress Notes (Signed)
Cardiology Consult Note    Date:  04/19/2020   ID:  LENTON GENDREAU, DOB [birth year], MRN [id number]  PCP:  Ayesha Rumpf, FNP  Cardiologist:  Armanda Magic, MD   Chief Complaint  Patient presents with  . Chest Pain    History of Present Illness:  Arthur Avila is a 44 y.o. male who is being seen today for the evaluation of chest pain at the request of Jacalyn Lefevre, MD.  This is a 44yo male with a hx of DM, HTN, morbid obesity who was recently seen in the ER with chest pain.  It has been off and on for over a week.  In ER EKG showed anterior and inferior infarct and hsTrop was normal x 2.  He was referred to Cardiology for further evaluation.    He tells me that he has been having chest pain off and on for several days.  There is no associated SOB, nausea or diaphoresis with the discomfort but he does get lightheadedness.  The pain is described as "someone pushing their index finger into my chest wall" and is located over his left breast and axilla with no radiation.  It is nonexertional and nothing makes it worse or better. He also states that he has been having problems with feeling tired during the day. He wakes up a lot to use the bathroom at night and then cannot get back to sleep.  He has been told that he snores but has never had a sleep study.  He denies any tobacco use and no family hx of CAD>    Past Medical History:  Diagnosis Date  . Diabetes mellitus without complication (HCC)   . DJD (degenerative joint disease) of knee 03/18/2012  . Hypertension   . HYPERTENSION 10/05/2009   Qualifier: Diagnosis of  By: Kem Parkinson    . Knee pain, bilateral 03/18/2012  . Morbid obesity (H[facility])   . Pure hypercholesterolemia 03/18/2012    No past surgical history on file.  Current Medications: Current Meds  Medication Sig  . losartan (COZAAR) 25 MG tablet Take 25 mg by mouth daily.  . metFORMIN (GLUCOPHAGE) 500 MG tablet Take 500 mg by mouth 2 (two) times daily with a meal.    . pravastatin (PRAVACHOL) 10 MG tablet Take 10 mg by mouth daily.    Allergies:   Lisinopril   Social History   Socioeconomic History  . Marital status: Single    Spouse name: Not on file  . Number of children: Not on file  . Years of education: Not on file  . Highest education level: Not on file  Occupational History  . Not on file  Tobacco Use  . Smoking status: Never Smoker  . Smokeless tobacco: Never Used  Substance and Sexual Activity  . Alcohol use: Yes    Comment: rarely  . Drug use: No  . Sexual activity: Yes  Other Topics Concern  . Not on file  Social History Narrative   Reviewed history from 10/05/2009 and no changes required.   He works at [employer].  He is an occasional     drinker, only maybe on the weekends when he goes out with friends.     Denies any tobacco.  States he did smoke some marijuana earlier this     year but he has never done any other illicit drugs.  He is single at     this point.      Smoking Status:  never  Social Determinants of Health   Financial Resource Strain:   . Difficulty of Paying Living Expenses:   Food Insecurity:   . Worried About Charity fundraiser in the Last Year:   . Arboriculturist in the Last Year:   Transportation Needs:   . Film/video editor (Medical):   Marland Kitchen Lack of Transportation (Non-Medical):   Physical Activity:   . Days of Exercise per Week:   . Minutes of Exercise per Session:   Stress:   . Feeling of Stress :   Social Connections:   . Frequency of Communication with Friends and Family:   . Frequency of Social Gatherings with Friends and Family:   . Attends Religious Services:   . Active Member of Clubs or Organizations:   . Attends Archivist Meetings:   Marland Kitchen Marital Status:      Family History:  The patient's family history includes Diabetes in an other family member; Hypertension in an other family member.   ROS:   Please see the history of present illness.    ROS All other  systems reviewed and are negative.  No flowsheet data found.     PHYSICAL EXAM:   VS:  BP 118/74   Pulse 83   Ht 6\' 1"  (1.854 m)   Wt (!) 324 lb 9.6 oz (147.2 kg)   SpO2 95%   BMI 42.83 kg/m    GEN: Well nourished, well developed, in no acute distress  HEENT: normal  Neck: no JVD, carotid bruits, or masses Cardiac: RRR; no murmurs, rubs, or gallops,no edema.  Intact distal pulses bilaterally.  Respiratory:  clear to auscultation bilaterally, normal work of breathing GI: soft, nontender, nondistended, + BS MS: no deformity or atrophy  Skin: warm and dry, no rash Neuro:  Alert and Oriented x 3, Strength and sensation are intact Psych: euthymic mood, full affect  Wt Readings from Last 3 Encounters:  04/19/20 (!) 324 lb 9.6 oz (147.2 kg)  04/28/12 (!) 392 lb (177.8 kg)  03/31/12 (!) 393 lb (178.3 kg)      Studies/Labs Reviewed:   EKG:  EKG is not ordered today.    Recent Labs: 04/11/2020: BUN 14; Creatinine, Ser 0.95; Hemoglobin 16.8; Platelets 240; Potassium 4.7; Sodium 135   Lipid Panel    Component Value Date/Time   CHOL 143 04/11/2020 1123   TRIG 71 04/11/2020 1123   HDL 34 (L) 04/11/2020 1123   CHOLHDL 4.2 04/11/2020 1123   VLDL 14 04/11/2020 1123   [place] 95 04/11/2020 1123    Additional studies/ records that were reviewed today include:  Hospital ER notes, labs and EKG    ASSESSMENT:    1. Chest pain of uncertain etiology   2. Essential hypertension   3. Pure hypercholesterolemia   4. Morbid obesity ([place])      PLAN:  In order of problems listed above:  1. Chest pain -this is atypical CP but he has CRFs including DM, HTN, HLD and morbid obesity -I have recommended a coronary CTA to define coronary anatomy  2.  HTN -BP controlled on exam -continue Losartan 25mg  daily  3.  HLD -LDL goal < 70 in setting of DM -His LDL was 95 and HDL 34 -continue Pravastatin 10mg  daily -PCP to address lipids  4.  Morbid Obesity -I have encouraged him to  get into a routine exercise program and cut back on carbs and   5.  Excessive daytime sleepiness -he wakes up at night to  urinate and cannot get back to sleep -he feels very tired during the day and snores at night -I suspect he has OSA -I will get a home sleep study  Medication Adjustments/Labs and Tests Ordered: Current medicines are reviewed at length with the patient today.  Concerns regarding medicines are outlined above.  Medication changes, Labs and Tests ordered today are listed in the Patient Instructions below.  There are no Patient Instructions on file for this visit.   Signed, Armanda Magic, MD  04/19/2020 9:03 AM    [facility] [address] [place], [place], [place]  [address] Phone: [phone]; Fax: [phone]

## 2020-04-19 NOTE — Telephone Encounter (Signed)
-----   Message from Theresia Majors, RN sent at 04/19/2020  9:29 AM EDT ----- Home sleep study has been ordered.  Thanks!

## 2020-04-19 NOTE — Patient Instructions (Addendum)
Medication Instructions:  Your physician recommends that you continue on your current medications as directed. Please refer to the Current Medication list given to you today.  *If you need a refill on your cardiac medications before your next appointment, please call your pharmacy*  Tests/Procedures: Your physician has recommended that you have a sleep study. This test records several body functions during sleep, including: brain activity, eye movement, oxygen and carbon dioxide blood levels, heart rate and rhythm, breathing rate and rhythm, the flow of air through your mouth and nose, snoring, body muscle movements, and chest and belly movement.   Follow-Up: At [facility], you and your health needs are our priority.  As part of our continuing mission to provide you with exceptional heart care, we have created designated Provider Care Teams.  These Care Teams include your primary Cardiologist (physician) and Advanced Practice Providers (APPs -  Physician Assistants and Nurse Practitioners) who all work together to provide you with the care you need, when you need it.  Follow up with Dr. Mayford Knife as needed based on results of testing.  Other Instructions Your cardiac CT will be scheduled at one of the below locations:   [facility] [address] [place], [place] [address] [phone]  Please arrive at the [facility] main entrance of [facility] 30 minutes prior to test start time. Proceed to the [facility] Radiology Department (first floor) to check-in and test prep.  Please follow these instructions carefully (unless otherwise directed):   On the Night Before the Test: . Be sure to Drink plenty of water. . Do not consume any caffeinated/decaffeinated beverages or chocolate 12 hours prior to your test. . Do not take any antihistamines 12 hours prior to your test. . If you take Metformin do not take 24 hours prior to test.  On the Day of the Test: . Drink  plenty of water. Do not drink any water within one hour of the test. . Do not eat any food 4 hours prior to the test. . You may take your regular medications prior to the test.  . Take metoprolol (Lopressor) two hours prior to test. . FEMALES- please wear underwire-free bra if available      After the Test: . Drink plenty of water. . After receiving IV contrast, you may experience a mild flushed feeling. This is normal. . On occasion, you may experience a mild rash up to 24 hours after the test. This is not dangerous. If this occurs, you can take Benadryl 25 mg and increase your fluid intake. . If you experience trouble breathing, this can be serious. If it is severe call 911 IMMEDIATELY. If it is mild, please call our office. . If you take any of these medications: Glipizide/Metformin, Avandament, Glucavance, please do not take 48 hours after completing test unless otherwise instructed.   Once we have confirmed authorization from your insurance company, we will call you to set up a date and time for your test.   For non-scheduling related questions, please contact the cardiac imaging nurse navigator should you have any questions/concerns: Rockwell Alexandria, RN Navigator Cardiac Imaging [facility] [phone] office  For scheduling needs, including cancellations and rescheduling, please call [phone].

## 2020-05-15 ENCOUNTER — Telehealth (HOSPITAL_COMMUNITY): Payer: Self-pay | Admitting: *Deleted

## 2020-05-15 ENCOUNTER — Ambulatory Visit: Payer: Self-pay | Admitting: Podiatry

## 2020-05-15 NOTE — Telephone Encounter (Signed)
Reaching out to patient to offer assistance regarding upcoming cardiac imaging study; pt verbalizes understanding of appt date/time, parking situation and where to check in, pre-test NPO status and medications ordered, and verified current allergies; name and call back number provided for further questions should they arise  [facility] [phone] office [phone] cell

## 2020-05-17 ENCOUNTER — Encounter: Payer: BC Managed Care – PPO | Admitting: *Deleted

## 2020-05-17 ENCOUNTER — Other Ambulatory Visit: Payer: Self-pay

## 2020-05-17 ENCOUNTER — Encounter (HOSPITAL_COMMUNITY): Payer: Self-pay

## 2020-05-17 ENCOUNTER — Ambulatory Visit (HOSPITAL_COMMUNITY)
Admission: RE | Admit: 2020-05-17 | Discharge: 2020-05-17 | Disposition: A | Discharge status: Home / Self Care | Payer: BC Managed Care – PPO | Source: Ambulatory Visit | Attending: Cardiology | Admitting: Cardiology

## 2020-05-17 DIAGNOSIS — Z006 Encounter for examination for normal comparison and control in clinical research program: Secondary | ICD-10-CM

## 2020-05-17 DIAGNOSIS — R072 Precordial pain: Secondary | ICD-10-CM | POA: Diagnosis not present

## 2020-05-17 MED ORDER — NITROGLYCERIN 0.4 MG SL SUBL
0.8000 mg | SUBLINGUAL_TABLET | Freq: Once | SUBLINGUAL | Status: AC
  Administered 2020-05-17: 0.8 mg via SUBLINGUAL

## 2020-05-17 MED ORDER — IOHEXOL 350 MG/ML SOLN
80.0000 mL | Freq: Once | INTRAVENOUS | Status: AC | PRN
  Administered 2020-05-17: 80 mL via INTRAVENOUS

## 2020-05-17 MED ORDER — NITROGLYCERIN 0.4 MG SL SUBL
SUBLINGUAL_TABLET | SUBLINGUAL | Status: AC
  Filled 2020-05-17: qty 2

## 2020-05-17 NOTE — Research (Signed)
CADFEM Informed Consent                  Subject Name:   Arthur Avila   Subject met inclusion and exclusion criteria.  The informed consent form, study requirements and expectations were reviewed with the subject and questions and concerns were addressed prior to the signing of the consent form.  The subject verbalized understanding of the trial requirements.  The subject agreed to participate in the CADFEM trial and signed the informed consent.  The informed consent was obtained prior to performance of any protocol-specific procedures for the subject.  A copy of the signed informed consent was given to the subject and a copy was placed in the subject's medical record.   [place] Carsen Machi, Research Assistant 05/17/2020  07:45 a.m.

## 2020-05-17 NOTE — Discharge Instructions (Signed)
Excuse from Work, School, or Physical Activity _______________________________________________________ needs to be excused from: ____ Work. ____ School. ____ Physical activity. This is effective for the following dates: ______________________________________. He or she may return to work or school, but should avoid physical activity or other activities from now until _______________. Activity restrictions include: ____ Lifting more than _________ lb. ____ Sitting longer than __________ minutes at a time. ____ Standing longer than ________ minutes at a time. ____ Other activities including: ___________________________________________________________________________________ ____ He or she may return to full physical activity on ________________. Health care provider name (printed): _____________________________________________________ Health care provider (signature): _________________________________________________________ Date: ______________________________ This information is not intended to replace advice given to you by your health care provider. Make sure you discuss any questions you have with your health care provider. Document Revised: 12/03/2017 Document Reviewed: 12/03/2017 Elsevier Patient Education  2020 [employer]  

## 2020-06-01 NOTE — Telephone Encounter (Signed)
  Carollee Massed, CMA Pre-cert required - Primary [employer] denied due to diagnosis        From: Gaynelle Cage, CMA  Sent: 05/31/2020  3:06 PM EDT  To: Pollyann Kennedy   This a patient that his insurance has to be called on. The CPT code will be G0399. Dr Mayford Knife is the provider. Diagnosis is G47.19.

## 2020-06-11 ENCOUNTER — Telehealth: Payer: Self-pay | Admitting: Cardiology

## 2020-06-11 NOTE — Telephone Encounter (Signed)
Pre-cert required for sleep study - Primary [employer] denied due to diagnosis

## 2020-07-27 ENCOUNTER — Other Ambulatory Visit: Payer: Self-pay

## 2020-07-27 ENCOUNTER — Encounter: Payer: Self-pay | Admitting: Podiatry

## 2020-07-27 ENCOUNTER — Ambulatory Visit (INDEPENDENT_AMBULATORY_CARE_PROVIDER_SITE_OTHER): Payer: BC Managed Care – PPO | Admitting: Podiatry

## 2020-07-27 DIAGNOSIS — M79675 Pain in left toe(s): Secondary | ICD-10-CM | POA: Insufficient documentation

## 2020-07-27 DIAGNOSIS — B351 Tinea unguium: Secondary | ICD-10-CM | POA: Diagnosis not present

## 2020-07-27 DIAGNOSIS — M79674 Pain in right toe(s): Secondary | ICD-10-CM

## 2020-07-27 NOTE — Progress Notes (Signed)
This patient presents to the office with chief complaint of long thick nails and diabetic feet.  This patient  says there  is  no pain and discomfort in their feet.  This patient says there are long thick painful nails.  These nails are painful walking and wearing shoes.  Patient has no history of infection or drainage from both feet.  Patient is unable to  self treat his own nails  Patient also has callus on the outside ball of his left foot. This patient presents  to the office today for treatment of the  long nails and a foot evaluation due to history of  diabetes.  General Appearance  Alert, conversant and in no acute stress.  Vascular  Dorsalis pedis and posterior tibial  pulses are palpable  bilaterally.  Capillary return is within normal limits  bilaterally. Temperature is within normal limits  bilaterally.  Neurologic  Senn-Weinstein monofilament wire test within normal limits  bilaterally. Muscle power within normal limits bilaterally.  Nails Thick disfigured discolored nails with subungual debris  from hallux to fifth toes bilaterally. No evidence of bacterial infection or drainage bilaterally.  Orthopedic  No limitations of motion of motion feet .  No crepitus or effusions noted.  No bony pathology or digital deformities noted. HAV  B/L.  Tailors bunion left foot.  Hammer toe second  B/L  Skin  normotropic skin with no porokeratosis noted bilaterally.  No signs of infections or ulcers noted.     Onychomycosis  Diabetes with no foot complications  IE  Debride nails x 10.  A diabetic foot exam was performed and there is no evidence of any vascular or neurologic pathology. Discussed proper footwear with this patient.  RTC 3 months.   Helane Gunther DPM

## 2021-01-10 DIAGNOSIS — U071 COVID-19: Secondary | ICD-10-CM | POA: Diagnosis not present

## 2021-01-10 DIAGNOSIS — E119 Type 2 diabetes mellitus without complications: Secondary | ICD-10-CM | POA: Diagnosis not present

## 2021-01-10 DIAGNOSIS — E7800 Pure hypercholesterolemia, unspecified: Secondary | ICD-10-CM | POA: Diagnosis not present

## 2021-01-10 DIAGNOSIS — I10 Essential (primary) hypertension: Secondary | ICD-10-CM | POA: Diagnosis not present

## 2021-01-15 DIAGNOSIS — E119 Type 2 diabetes mellitus without complications: Secondary | ICD-10-CM | POA: Diagnosis not present

## 2021-01-15 DIAGNOSIS — E7800 Pure hypercholesterolemia, unspecified: Secondary | ICD-10-CM | POA: Diagnosis not present

## 2021-01-15 DIAGNOSIS — I10 Essential (primary) hypertension: Secondary | ICD-10-CM | POA: Diagnosis not present

## 2021-02-13 DIAGNOSIS — E785 Hyperlipidemia, unspecified: Secondary | ICD-10-CM | POA: Diagnosis not present

## 2021-02-13 DIAGNOSIS — E1165 Type 2 diabetes mellitus with hyperglycemia: Secondary | ICD-10-CM | POA: Diagnosis not present

## 2021-02-13 DIAGNOSIS — I10 Essential (primary) hypertension: Secondary | ICD-10-CM | POA: Diagnosis not present

## 2021-02-27 DIAGNOSIS — I10 Essential (primary) hypertension: Secondary | ICD-10-CM | POA: Diagnosis not present

## 2021-02-27 DIAGNOSIS — E785 Hyperlipidemia, unspecified: Secondary | ICD-10-CM | POA: Diagnosis not present

## 2021-02-27 DIAGNOSIS — E1165 Type 2 diabetes mellitus with hyperglycemia: Secondary | ICD-10-CM | POA: Diagnosis not present

## 2021-03-13 DIAGNOSIS — I10 Essential (primary) hypertension: Secondary | ICD-10-CM | POA: Diagnosis not present

## 2021-03-13 DIAGNOSIS — E1165 Type 2 diabetes mellitus with hyperglycemia: Secondary | ICD-10-CM | POA: Diagnosis not present

## 2021-03-13 DIAGNOSIS — E785 Hyperlipidemia, unspecified: Secondary | ICD-10-CM | POA: Diagnosis not present

## 2021-07-17 DIAGNOSIS — E1165 Type 2 diabetes mellitus with hyperglycemia: Secondary | ICD-10-CM | POA: Diagnosis not present

## 2021-07-17 DIAGNOSIS — E785 Hyperlipidemia, unspecified: Secondary | ICD-10-CM | POA: Diagnosis not present

## 2021-07-24 DIAGNOSIS — I10 Essential (primary) hypertension: Secondary | ICD-10-CM | POA: Diagnosis not present

## 2021-07-24 DIAGNOSIS — E1165 Type 2 diabetes mellitus with hyperglycemia: Secondary | ICD-10-CM | POA: Diagnosis not present

## 2021-07-24 DIAGNOSIS — E785 Hyperlipidemia, unspecified: Secondary | ICD-10-CM | POA: Diagnosis not present

## 2021-08-15 DIAGNOSIS — E1169 Type 2 diabetes mellitus with other specified complication: Secondary | ICD-10-CM | POA: Diagnosis not present

## 2021-08-15 DIAGNOSIS — I10 Essential (primary) hypertension: Secondary | ICD-10-CM | POA: Diagnosis not present

## 2021-08-15 DIAGNOSIS — E7800 Pure hypercholesterolemia, unspecified: Secondary | ICD-10-CM | POA: Diagnosis not present

## 2021-08-15 DIAGNOSIS — Z6841 Body Mass Index (BMI) 40.0 and over, adult: Secondary | ICD-10-CM | POA: Diagnosis not present

## 2021-08-29 DIAGNOSIS — G4700 Insomnia, unspecified: Secondary | ICD-10-CM | POA: Diagnosis not present

## 2021-09-26 DIAGNOSIS — G4719 Other hypersomnia: Secondary | ICD-10-CM | POA: Diagnosis not present

## 2021-09-26 DIAGNOSIS — G4700 Insomnia, unspecified: Secondary | ICD-10-CM | POA: Diagnosis not present

## 2021-10-11 DIAGNOSIS — N50812 Left testicular pain: Secondary | ICD-10-CM | POA: Diagnosis not present

## 2021-10-17 DIAGNOSIS — G4733 Obstructive sleep apnea (adult) (pediatric): Secondary | ICD-10-CM | POA: Diagnosis not present

## 2021-10-24 DIAGNOSIS — E785 Hyperlipidemia, unspecified: Secondary | ICD-10-CM | POA: Diagnosis not present

## 2021-10-24 DIAGNOSIS — I10 Essential (primary) hypertension: Secondary | ICD-10-CM | POA: Diagnosis not present

## 2021-10-24 DIAGNOSIS — E1165 Type 2 diabetes mellitus with hyperglycemia: Secondary | ICD-10-CM | POA: Diagnosis not present

## 2021-11-06 DIAGNOSIS — G4733 Obstructive sleep apnea (adult) (pediatric): Secondary | ICD-10-CM | POA: Diagnosis not present

## 2021-12-06 DIAGNOSIS — G4733 Obstructive sleep apnea (adult) (pediatric): Secondary | ICD-10-CM | POA: Diagnosis not present

## 2022-01-06 DIAGNOSIS — G4733 Obstructive sleep apnea (adult) (pediatric): Secondary | ICD-10-CM | POA: Diagnosis not present

## 2022-02-06 DIAGNOSIS — G4733 Obstructive sleep apnea (adult) (pediatric): Secondary | ICD-10-CM | POA: Diagnosis not present

## 2022-02-21 DIAGNOSIS — E1165 Type 2 diabetes mellitus with hyperglycemia: Secondary | ICD-10-CM | POA: Diagnosis not present

## 2022-06-04 DIAGNOSIS — I10 Essential (primary) hypertension: Secondary | ICD-10-CM | POA: Diagnosis not present

## 2022-06-04 DIAGNOSIS — Z125 Encounter for screening for malignant neoplasm of prostate: Secondary | ICD-10-CM | POA: Diagnosis not present

## 2022-06-04 DIAGNOSIS — E785 Hyperlipidemia, unspecified: Secondary | ICD-10-CM | POA: Diagnosis not present

## 2022-06-04 DIAGNOSIS — E1165 Type 2 diabetes mellitus with hyperglycemia: Secondary | ICD-10-CM | POA: Diagnosis not present

## 2022-06-11 DIAGNOSIS — I10 Essential (primary) hypertension: Secondary | ICD-10-CM | POA: Diagnosis not present

## 2022-06-11 DIAGNOSIS — E7800 Pure hypercholesterolemia, unspecified: Secondary | ICD-10-CM | POA: Diagnosis not present

## 2022-06-11 DIAGNOSIS — Z23 Encounter for immunization: Secondary | ICD-10-CM | POA: Diagnosis not present

## 2022-06-11 DIAGNOSIS — E1165 Type 2 diabetes mellitus with hyperglycemia: Secondary | ICD-10-CM | POA: Diagnosis not present

## 2022-09-03 DIAGNOSIS — I10 Essential (primary) hypertension: Secondary | ICD-10-CM | POA: Diagnosis not present

## 2022-09-03 DIAGNOSIS — E7800 Pure hypercholesterolemia, unspecified: Secondary | ICD-10-CM | POA: Diagnosis not present

## 2022-09-03 DIAGNOSIS — E1165 Type 2 diabetes mellitus with hyperglycemia: Secondary | ICD-10-CM | POA: Diagnosis not present

## 2022-09-10 DIAGNOSIS — I10 Essential (primary) hypertension: Secondary | ICD-10-CM | POA: Diagnosis not present

## 2022-09-10 DIAGNOSIS — E7800 Pure hypercholesterolemia, unspecified: Secondary | ICD-10-CM | POA: Diagnosis not present

## 2022-09-10 DIAGNOSIS — E1165 Type 2 diabetes mellitus with hyperglycemia: Secondary | ICD-10-CM | POA: Diagnosis not present

## 2022-10-16 DIAGNOSIS — R197 Diarrhea, unspecified: Secondary | ICD-10-CM | POA: Diagnosis not present

## 2022-10-16 DIAGNOSIS — R109 Unspecified abdominal pain: Secondary | ICD-10-CM | POA: Diagnosis not present

## 2022-10-16 DIAGNOSIS — R110 Nausea: Secondary | ICD-10-CM | POA: Diagnosis not present

## 2023-02-25 DIAGNOSIS — E1165 Type 2 diabetes mellitus with hyperglycemia: Secondary | ICD-10-CM | POA: Diagnosis not present

## 2023-03-04 DIAGNOSIS — E1165 Type 2 diabetes mellitus with hyperglycemia: Secondary | ICD-10-CM | POA: Diagnosis not present

## 2023-03-04 DIAGNOSIS — I10 Essential (primary) hypertension: Secondary | ICD-10-CM | POA: Diagnosis not present

## 2023-03-04 DIAGNOSIS — E7800 Pure hypercholesterolemia, unspecified: Secondary | ICD-10-CM | POA: Diagnosis not present

## 2023-03-04 DIAGNOSIS — Z125 Encounter for screening for malignant neoplasm of prostate: Secondary | ICD-10-CM | POA: Diagnosis not present

## 2023-04-08 DIAGNOSIS — G479 Sleep disorder, unspecified: Secondary | ICD-10-CM | POA: Diagnosis not present

## 2023-04-08 DIAGNOSIS — E1165 Type 2 diabetes mellitus with hyperglycemia: Secondary | ICD-10-CM | POA: Diagnosis not present

## 2023-04-08 DIAGNOSIS — I10 Essential (primary) hypertension: Secondary | ICD-10-CM | POA: Diagnosis not present

## 2023-04-08 DIAGNOSIS — M545 Low back pain, unspecified: Secondary | ICD-10-CM | POA: Diagnosis not present

## 2023-05-20 DIAGNOSIS — E1165 Type 2 diabetes mellitus with hyperglycemia: Secondary | ICD-10-CM | POA: Diagnosis not present

## 2023-11-16 DIAGNOSIS — Z125 Encounter for screening for malignant neoplasm of prostate: Secondary | ICD-10-CM | POA: Diagnosis not present

## 2023-11-16 DIAGNOSIS — E7800 Pure hypercholesterolemia, unspecified: Secondary | ICD-10-CM | POA: Diagnosis not present

## 2023-11-16 DIAGNOSIS — E1165 Type 2 diabetes mellitus with hyperglycemia: Secondary | ICD-10-CM | POA: Diagnosis not present

## 2023-11-24 DIAGNOSIS — I10 Essential (primary) hypertension: Secondary | ICD-10-CM | POA: Diagnosis not present

## 2023-11-24 DIAGNOSIS — E7800 Pure hypercholesterolemia, unspecified: Secondary | ICD-10-CM | POA: Diagnosis not present

## 2023-11-24 DIAGNOSIS — G4733 Obstructive sleep apnea (adult) (pediatric): Secondary | ICD-10-CM | POA: Diagnosis not present

## 2023-11-24 DIAGNOSIS — E1165 Type 2 diabetes mellitus with hyperglycemia: Secondary | ICD-10-CM | POA: Diagnosis not present

## 2023-11-24 DIAGNOSIS — Z0000 Encounter for general adult medical examination without abnormal findings: Secondary | ICD-10-CM | POA: Diagnosis not present

## 2024-02-01 DIAGNOSIS — Z1211 Encounter for screening for malignant neoplasm of colon: Secondary | ICD-10-CM | POA: Diagnosis not present

## 2024-02-01 DIAGNOSIS — E119 Type 2 diabetes mellitus without complications: Secondary | ICD-10-CM | POA: Diagnosis not present

## 2024-02-01 DIAGNOSIS — K625 Hemorrhage of anus and rectum: Secondary | ICD-10-CM | POA: Diagnosis not present

## 2024-04-25 DIAGNOSIS — E1165 Type 2 diabetes mellitus with hyperglycemia: Secondary | ICD-10-CM | POA: Diagnosis not present

## 2024-05-18 DIAGNOSIS — R6882 Decreased libido: Secondary | ICD-10-CM | POA: Diagnosis not present

## 2024-05-18 DIAGNOSIS — G4709 Other insomnia: Secondary | ICD-10-CM | POA: Diagnosis not present

## 2024-05-18 DIAGNOSIS — E7800 Pure hypercholesterolemia, unspecified: Secondary | ICD-10-CM | POA: Diagnosis not present

## 2024-05-18 DIAGNOSIS — E1165 Type 2 diabetes mellitus with hyperglycemia: Secondary | ICD-10-CM | POA: Diagnosis not present

## 2024-05-18 DIAGNOSIS — R5383 Other fatigue: Secondary | ICD-10-CM | POA: Diagnosis not present

## 2024-05-25 DIAGNOSIS — I10 Essential (primary) hypertension: Secondary | ICD-10-CM | POA: Diagnosis not present

## 2024-05-25 DIAGNOSIS — G4733 Obstructive sleep apnea (adult) (pediatric): Secondary | ICD-10-CM | POA: Diagnosis not present

## 2024-05-25 DIAGNOSIS — Z9989 Dependence on other enabling machines and devices: Secondary | ICD-10-CM | POA: Diagnosis not present

## 2024-05-25 DIAGNOSIS — E1165 Type 2 diabetes mellitus with hyperglycemia: Secondary | ICD-10-CM | POA: Diagnosis not present

## 2024-08-09 DIAGNOSIS — M5030 Other cervical disc degeneration, unspecified cervical region: Secondary | ICD-10-CM | POA: Diagnosis not present
